# Patient Record
Sex: Female | Born: 1955
Health system: Southern US, Community
[De-identification: ages and names within clinical notes are randomized; demographics above are authoritative.]

## PROBLEM LIST (undated history)

## (undated) DIAGNOSIS — E039 Hypothyroidism, unspecified: Secondary | ICD-10-CM

## (undated) DIAGNOSIS — Z23 Encounter for immunization: Principal | ICD-10-CM

## (undated) DIAGNOSIS — E785 Hyperlipidemia, unspecified: Secondary | ICD-10-CM

## (undated) DIAGNOSIS — E079 Disorder of thyroid, unspecified: Secondary | ICD-10-CM

## (undated) DIAGNOSIS — C8190 Hodgkin lymphoma, unspecified, unspecified site: Secondary | ICD-10-CM

## (undated) HISTORY — DX: Encounter for immunization: Z23

## (undated) HISTORY — DX: Hyperlipidemia, unspecified: E78.5

## (undated) HISTORY — PX: TONSILLECTOMY: SUR1361

## (undated) HISTORY — DX: Disorder of thyroid, unspecified: E07.9

## (undated) HISTORY — DX: Hodgkin lymphoma, unspecified, unspecified site: C81.90

## (undated) HISTORY — DX: Hypothyroidism, unspecified: E03.9

## (undated) HISTORY — PX: OTHER SURGICAL HISTORY: SHX169

---

## 1998-01-19 ENCOUNTER — Other Ambulatory Visit: Admission: RE | Admit: 1998-01-19 | Discharge: 1998-01-19 | Payer: Self-pay | Admitting: Obstetrics and Gynecology

## 1999-03-06 ENCOUNTER — Other Ambulatory Visit: Admission: RE | Admit: 1999-03-06 | Discharge: 1999-03-06 | Payer: Self-pay | Admitting: Obstetrics and Gynecology

## 1999-11-14 ENCOUNTER — Encounter: Payer: Self-pay | Admitting: Obstetrics and Gynecology

## 1999-11-14 ENCOUNTER — Encounter: Admission: RE | Admit: 1999-11-14 | Discharge: 1999-11-14 | Payer: Self-pay | Admitting: Obstetrics and Gynecology

## 2000-02-21 ENCOUNTER — Encounter: Admission: RE | Admit: 2000-02-21 | Discharge: 2000-02-21 | Payer: Self-pay | Admitting: Oncology

## 2000-02-21 ENCOUNTER — Encounter (HOSPITAL_COMMUNITY): Payer: Self-pay | Admitting: Oncology

## 2000-05-19 ENCOUNTER — Other Ambulatory Visit: Admission: RE | Admit: 2000-05-19 | Discharge: 2000-05-19 | Payer: Self-pay | Admitting: Obstetrics and Gynecology

## 2000-12-25 ENCOUNTER — Encounter: Admission: RE | Admit: 2000-12-25 | Discharge: 2000-12-25 | Payer: Self-pay | Admitting: Oncology

## 2000-12-25 ENCOUNTER — Encounter (HOSPITAL_COMMUNITY): Payer: Self-pay | Admitting: Oncology

## 2001-02-19 ENCOUNTER — Encounter: Admission: RE | Admit: 2001-02-19 | Discharge: 2001-02-19 | Payer: Self-pay | Admitting: Oncology

## 2001-02-19 ENCOUNTER — Encounter (HOSPITAL_COMMUNITY): Admission: RE | Admit: 2001-02-19 | Discharge: 2001-03-10 | Payer: Self-pay | Admitting: Oncology

## 2001-02-19 ENCOUNTER — Encounter (HOSPITAL_COMMUNITY): Payer: Self-pay | Admitting: Oncology

## 2001-05-31 ENCOUNTER — Other Ambulatory Visit: Admission: RE | Admit: 2001-05-31 | Discharge: 2001-05-31 | Payer: Self-pay | Admitting: Obstetrics and Gynecology

## 2002-03-14 ENCOUNTER — Encounter: Payer: Self-pay | Admitting: Obstetrics and Gynecology

## 2002-03-14 ENCOUNTER — Encounter: Admission: RE | Admit: 2002-03-14 | Discharge: 2002-03-14 | Payer: Self-pay | Admitting: Obstetrics and Gynecology

## 2002-03-30 ENCOUNTER — Encounter: Admission: RE | Admit: 2002-03-30 | Discharge: 2002-03-30 | Payer: Self-pay | Admitting: Oncology

## 2002-03-30 ENCOUNTER — Encounter (HOSPITAL_COMMUNITY): Payer: Self-pay | Admitting: Oncology

## 2002-09-20 ENCOUNTER — Encounter: Admission: RE | Admit: 2002-09-20 | Discharge: 2002-09-20 | Payer: Self-pay | Admitting: Oncology

## 2002-09-20 ENCOUNTER — Encounter (HOSPITAL_COMMUNITY): Payer: Self-pay | Admitting: Oncology

## 2002-09-27 ENCOUNTER — Other Ambulatory Visit: Admission: RE | Admit: 2002-09-27 | Discharge: 2002-09-27 | Payer: Self-pay | Admitting: Obstetrics and Gynecology

## 2003-04-14 ENCOUNTER — Encounter: Admission: RE | Admit: 2003-04-14 | Discharge: 2003-04-14 | Payer: Self-pay | Admitting: Oncology

## 2003-04-14 ENCOUNTER — Encounter (HOSPITAL_COMMUNITY): Payer: Self-pay | Admitting: Oncology

## 2003-04-14 ENCOUNTER — Encounter (HOSPITAL_COMMUNITY): Admission: RE | Admit: 2003-04-14 | Discharge: 2003-05-11 | Payer: Self-pay | Admitting: Oncology

## 2003-04-24 ENCOUNTER — Encounter: Admission: RE | Admit: 2003-04-24 | Discharge: 2003-04-24 | Payer: Self-pay | Admitting: Obstetrics and Gynecology

## 2003-04-24 ENCOUNTER — Encounter: Payer: Self-pay | Admitting: Obstetrics and Gynecology

## 2003-10-24 ENCOUNTER — Other Ambulatory Visit: Admission: RE | Admit: 2003-10-24 | Discharge: 2003-10-24 | Payer: Self-pay | Admitting: Obstetrics and Gynecology

## 2004-04-10 ENCOUNTER — Encounter: Admission: RE | Admit: 2004-04-10 | Discharge: 2004-05-10 | Payer: Self-pay | Admitting: Oncology

## 2004-04-10 ENCOUNTER — Encounter (HOSPITAL_COMMUNITY): Admission: RE | Admit: 2004-04-10 | Discharge: 2004-05-10 | Payer: Self-pay | Admitting: Oncology

## 2004-04-10 ENCOUNTER — Encounter: Admission: RE | Admit: 2004-04-10 | Discharge: 2004-04-10 | Payer: Self-pay | Admitting: Oncology

## 2004-04-25 ENCOUNTER — Encounter: Admission: RE | Admit: 2004-04-25 | Discharge: 2004-04-25 | Payer: Self-pay | Admitting: Oncology

## 2004-04-25 ENCOUNTER — Encounter: Admission: RE | Admit: 2004-04-25 | Discharge: 2004-04-25 | Payer: Self-pay | Admitting: Obstetrics and Gynecology

## 2005-04-09 ENCOUNTER — Ambulatory Visit (HOSPITAL_COMMUNITY): Payer: Self-pay | Admitting: Oncology

## 2005-04-09 ENCOUNTER — Encounter: Admission: RE | Admit: 2005-04-09 | Discharge: 2005-05-09 | Payer: Self-pay | Admitting: Oncology

## 2005-04-09 ENCOUNTER — Encounter: Admission: RE | Admit: 2005-04-09 | Discharge: 2005-04-09 | Payer: Self-pay | Admitting: Oncology

## 2005-04-09 ENCOUNTER — Encounter (HOSPITAL_COMMUNITY): Admission: RE | Admit: 2005-04-09 | Discharge: 2005-05-09 | Payer: Self-pay | Admitting: Oncology

## 2005-05-22 ENCOUNTER — Encounter: Admission: RE | Admit: 2005-05-22 | Discharge: 2005-05-22 | Payer: Self-pay | Admitting: Obstetrics and Gynecology

## 2006-04-27 ENCOUNTER — Ambulatory Visit (HOSPITAL_COMMUNITY): Payer: Self-pay | Admitting: Oncology

## 2006-04-27 ENCOUNTER — Encounter: Admission: RE | Admit: 2006-04-27 | Discharge: 2006-05-08 | Payer: Self-pay | Admitting: Oncology

## 2006-04-27 ENCOUNTER — Encounter (HOSPITAL_COMMUNITY): Admission: RE | Admit: 2006-04-27 | Discharge: 2006-05-08 | Payer: Self-pay | Admitting: Oncology

## 2006-04-27 ENCOUNTER — Encounter: Admission: RE | Admit: 2006-04-27 | Discharge: 2006-04-27 | Payer: Self-pay | Admitting: Oncology

## 2006-06-17 ENCOUNTER — Encounter: Admission: RE | Admit: 2006-06-17 | Discharge: 2006-06-17 | Payer: Self-pay | Admitting: Obstetrics and Gynecology

## 2007-05-21 ENCOUNTER — Encounter: Admission: RE | Admit: 2007-05-21 | Discharge: 2007-05-21 | Payer: Self-pay | Admitting: Oncology

## 2007-05-21 ENCOUNTER — Encounter (HOSPITAL_COMMUNITY): Admission: RE | Admit: 2007-05-21 | Discharge: 2007-06-20 | Payer: Self-pay | Admitting: Oncology

## 2007-05-21 ENCOUNTER — Ambulatory Visit (HOSPITAL_COMMUNITY): Payer: Self-pay | Admitting: Oncology

## 2007-06-30 ENCOUNTER — Encounter: Admission: RE | Admit: 2007-06-30 | Discharge: 2007-06-30 | Payer: Self-pay | Admitting: Obstetrics and Gynecology

## 2008-05-26 ENCOUNTER — Encounter: Admission: RE | Admit: 2008-05-26 | Discharge: 2008-05-26 | Payer: Self-pay | Admitting: Oncology

## 2008-05-26 ENCOUNTER — Encounter (HOSPITAL_COMMUNITY): Admission: RE | Admit: 2008-05-26 | Discharge: 2008-06-25 | Payer: Self-pay | Admitting: Oncology

## 2008-05-26 ENCOUNTER — Ambulatory Visit (HOSPITAL_COMMUNITY): Payer: Self-pay | Admitting: Oncology

## 2008-06-30 ENCOUNTER — Encounter: Admission: RE | Admit: 2008-06-30 | Discharge: 2008-06-30 | Payer: Self-pay | Admitting: Oncology

## 2008-07-02 ENCOUNTER — Encounter: Admission: RE | Admit: 2008-07-02 | Discharge: 2008-07-02 | Payer: Self-pay | Admitting: Oncology

## 2009-05-25 ENCOUNTER — Encounter (HOSPITAL_COMMUNITY): Admission: RE | Admit: 2009-05-25 | Discharge: 2009-06-24 | Payer: Self-pay | Admitting: Oncology

## 2009-05-25 ENCOUNTER — Ambulatory Visit (HOSPITAL_COMMUNITY): Payer: Self-pay | Admitting: Oncology

## 2009-07-16 ENCOUNTER — Encounter: Admission: RE | Admit: 2009-07-16 | Discharge: 2009-07-16 | Payer: Self-pay | Admitting: Obstetrics and Gynecology

## 2009-08-24 ENCOUNTER — Encounter (HOSPITAL_COMMUNITY): Admission: RE | Admit: 2009-08-24 | Discharge: 2009-09-23 | Payer: Self-pay | Admitting: Oncology

## 2009-08-24 ENCOUNTER — Ambulatory Visit (HOSPITAL_COMMUNITY): Payer: Self-pay | Admitting: Oncology

## 2010-06-17 ENCOUNTER — Encounter (HOSPITAL_COMMUNITY)
Admission: RE | Admit: 2010-06-17 | Discharge: 2010-07-17 | Payer: Self-pay | Source: Home / Self Care | Admitting: Oncology

## 2010-06-17 ENCOUNTER — Encounter: Admission: RE | Admit: 2010-06-17 | Discharge: 2010-06-17 | Payer: Self-pay | Admitting: Oncology

## 2010-06-17 ENCOUNTER — Ambulatory Visit (HOSPITAL_COMMUNITY): Payer: Self-pay | Admitting: Oncology

## 2010-08-28 ENCOUNTER — Encounter
Admission: RE | Admit: 2010-08-28 | Discharge: 2010-08-28 | Payer: Self-pay | Source: Home / Self Care | Attending: Obstetrics and Gynecology | Admitting: Obstetrics and Gynecology

## 2010-09-16 ENCOUNTER — Other Ambulatory Visit (HOSPITAL_COMMUNITY): Payer: Self-pay | Admitting: Oncology

## 2010-09-16 DIAGNOSIS — Z8571 Personal history of Hodgkin lymphoma: Secondary | ICD-10-CM

## 2010-10-01 ENCOUNTER — Ambulatory Visit
Admission: RE | Admit: 2010-10-01 | Discharge: 2010-10-01 | Disposition: A | Discharge status: Home / Self Care | Payer: 59 | Source: Ambulatory Visit | Attending: Oncology | Admitting: Oncology

## 2010-10-01 DIAGNOSIS — Z8571 Personal history of Hodgkin lymphoma: Secondary | ICD-10-CM

## 2010-10-01 MED ORDER — GADOBENATE DIMEGLUMINE 529 MG/ML IV SOLN
16.0000 mL | Freq: Once | INTRAVENOUS | Status: AC | PRN
  Administered 2010-10-01: 16 mL via INTRAVENOUS

## 2010-10-22 LAB — DIFFERENTIAL
Basophils Absolute: 0 10*3/uL (ref 0.0–0.1)
Lymphocytes Relative: 19 % (ref 12–46)
Lymphs Abs: 1.3 10*3/uL (ref 0.7–4.0)
Monocytes Absolute: 0.7 10*3/uL (ref 0.1–1.0)
Monocytes Relative: 10 % (ref 3–12)
Neutrophils Relative %: 71 % (ref 43–77)

## 2010-10-22 LAB — COMPREHENSIVE METABOLIC PANEL
Albumin: 4 g/dL (ref 3.5–5.2)
CO2: 26 mEq/L (ref 19–32)
Calcium: 9.4 mg/dL (ref 8.4–10.5)
Chloride: 106 mEq/L (ref 96–112)
Creatinine, Ser: 0.97 mg/dL (ref 0.4–1.2)
GFR calc Af Amer: >60 mL/min (ref >60)
GFR calc non Af Amer: 60 mL/min — ABNORMAL LOW (ref >60)
Glucose, Bld: 104 mg/dL — ABNORMAL HIGH (ref 70–99)
Potassium: 4.3 mEq/L (ref 3.5–5.1)
Sodium: 140 mEq/L (ref 135–145)
Total Protein: 7 g/dL (ref 6.0–8.3)

## 2010-10-22 LAB — LIPID PANEL
HDL: 61 mg/dL (ref >39)
Total CHOL/HDL Ratio: 3.4 RATIO
Triglycerides: 47 mg/dL (ref <150)
VLDL: 9 mg/dL (ref 0–40)

## 2010-10-22 LAB — SEDIMENTATION RATE: Sed Rate: 5 mm/hr (ref 0–22)

## 2010-10-22 LAB — CBC
RDW: 14.2 % (ref 11.5–15.5)
WBC: 7.1 10*3/uL (ref 4.0–10.5)

## 2010-10-22 LAB — LACTATE DEHYDROGENASE: LDH: 142 U/L (ref 94–250)

## 2010-10-22 LAB — FOLATE: Folate: >20 ng/mL

## 2010-10-26 LAB — TSH: TSH: 0.912 u[IU]/mL (ref 0.350–4.500)

## 2010-11-14 LAB — DIFFERENTIAL
Basophils Absolute: 0 10*3/uL (ref 0.0–0.1)
Basophils Relative: 0 % (ref 0–1)
Eosinophils Absolute: 0.1 10*3/uL (ref 0.0–0.7)
Eosinophils Relative: 2 % (ref 0–5)
Lymphocytes Relative: 18 % (ref 12–46)

## 2010-11-14 LAB — COMPREHENSIVE METABOLIC PANEL
ALT: 20 U/L (ref 0–35)
AST: 21 U/L (ref 0–37)
Alkaline Phosphatase: 67 U/L (ref 39–117)
CO2: 27 mEq/L (ref 19–32)
Chloride: 103 mEq/L (ref 96–112)
Creatinine, Ser: 0.9 mg/dL (ref 0.4–1.2)
GFR calc Af Amer: >60 mL/min (ref >60)
GFR calc non Af Amer: >60 mL/min (ref >60)
Potassium: 4.4 mEq/L (ref 3.5–5.1)
Total Bilirubin: 1.1 mg/dL (ref 0.3–1.2)

## 2010-11-14 LAB — CBC
MCV: 90.7 fL (ref 78.0–100.0)
RBC: 4.94 MIL/uL (ref 3.87–5.11)
WBC: 7.7 10*3/uL (ref 4.0–10.5)

## 2010-11-14 LAB — LIPID PANEL
HDL: 55 mg/dL (ref >39)
Total CHOL/HDL Ratio: 4.1 RATIO
Triglycerides: 74 mg/dL (ref <150)

## 2010-11-14 LAB — SEDIMENTATION RATE: Sed Rate: 20 mm/hr (ref 0–22)

## 2010-12-16 ENCOUNTER — Other Ambulatory Visit (HOSPITAL_COMMUNITY): Payer: Self-pay | Admitting: Oncology

## 2010-12-16 ENCOUNTER — Encounter (HOSPITAL_COMMUNITY): Payer: 59 | Attending: Oncology

## 2010-12-16 DIAGNOSIS — E039 Hypothyroidism, unspecified: Secondary | ICD-10-CM | POA: Insufficient documentation

## 2010-12-16 DIAGNOSIS — C8119 Nodular sclerosis classical Hodgkin lymphoma, extranodal and solid organ sites: Secondary | ICD-10-CM | POA: Insufficient documentation

## 2010-12-16 DIAGNOSIS — C8190 Hodgkin lymphoma, unspecified, unspecified site: Secondary | ICD-10-CM

## 2010-12-16 DIAGNOSIS — Z9221 Personal history of antineoplastic chemotherapy: Secondary | ICD-10-CM | POA: Insufficient documentation

## 2010-12-16 DIAGNOSIS — Z79899 Other long term (current) drug therapy: Secondary | ICD-10-CM | POA: Insufficient documentation

## 2010-12-16 LAB — TSH: TSH: 1.414 u[IU]/mL (ref 0.350–4.500)

## 2011-05-12 LAB — DIFFERENTIAL
Basophils Absolute: 0
Eosinophils Relative: 1
Lymphocytes Relative: 15
Monocytes Absolute: 0.7
Monocytes Relative: 9
Neutro Abs: 6.3

## 2011-05-12 LAB — CBC
Platelets: 217
RDW: 14.7
WBC: 8.4

## 2011-05-12 LAB — COMPREHENSIVE METABOLIC PANEL
AST: 20
Albumin: 4.1
Alkaline Phosphatase: 61
Chloride: 107
Creatinine, Ser: 0.9
GFR calc Af Amer: >60
Potassium: 4.6
Total Bilirubin: 1
Total Protein: 6.9

## 2011-05-12 LAB — LIPID PANEL
Cholesterol: 209 — ABNORMAL HIGH
HDL: 46
LDL Cholesterol: 152 — ABNORMAL HIGH
Total CHOL/HDL Ratio: 4.5
VLDL: 11

## 2011-05-22 LAB — CBC
HCT: 43.1
MCHC: 33.7
MCV: 88.7
Platelets: 257
RDW: 14.6 — ABNORMAL HIGH
WBC: 7.9

## 2011-05-22 LAB — LIPID PANEL
Cholesterol: 213 — ABNORMAL HIGH
Triglycerides: 83

## 2011-05-22 LAB — DIFFERENTIAL
Basophils Absolute: 0
Eosinophils Relative: 3
Lymphocytes Relative: 25
Lymphs Abs: 2
Monocytes Absolute: 0.8 — ABNORMAL HIGH
Neutro Abs: 4.8

## 2011-05-22 LAB — LACTATE DEHYDROGENASE: LDH: 138

## 2011-05-22 LAB — COMPREHENSIVE METABOLIC PANEL
AST: 16
Albumin: 4
BUN: 19
Calcium: 9.7
Creatinine, Ser: 0.96
GFR calc Af Amer: >60
Total Bilirubin: 0.9
Total Protein: 6.9

## 2011-05-22 LAB — TSH: TSH: 4.155

## 2011-06-16 ENCOUNTER — Encounter (HOSPITAL_COMMUNITY): Payer: Self-pay | Admitting: Oncology

## 2011-06-16 ENCOUNTER — Ambulatory Visit
Admission: RE | Admit: 2011-06-16 | Discharge: 2011-06-16 | Disposition: A | Discharge status: Home / Self Care | Payer: 59 | Source: Ambulatory Visit | Attending: Oncology | Admitting: Oncology

## 2011-06-16 ENCOUNTER — Other Ambulatory Visit (HOSPITAL_COMMUNITY): Payer: Self-pay | Admitting: Oncology

## 2011-06-16 ENCOUNTER — Encounter (HOSPITAL_COMMUNITY): Payer: 59 | Attending: Oncology | Admitting: Oncology

## 2011-06-16 DIAGNOSIS — E7800 Pure hypercholesterolemia, unspecified: Secondary | ICD-10-CM | POA: Insufficient documentation

## 2011-06-16 DIAGNOSIS — C8190 Hodgkin lymphoma, unspecified, unspecified site: Secondary | ICD-10-CM | POA: Insufficient documentation

## 2011-06-16 DIAGNOSIS — E079 Disorder of thyroid, unspecified: Secondary | ICD-10-CM | POA: Insufficient documentation

## 2011-06-16 DIAGNOSIS — C8119 Nodular sclerosis classical Hodgkin lymphoma, extranodal and solid organ sites: Secondary | ICD-10-CM

## 2011-06-16 DIAGNOSIS — E039 Hypothyroidism, unspecified: Secondary | ICD-10-CM

## 2011-06-16 LAB — COMPREHENSIVE METABOLIC PANEL
ALT: 17 U/L (ref 0–35)
AST: 17 U/L (ref 0–37)
Alkaline Phosphatase: 85 U/L (ref 39–117)
CO2: 30 mEq/L (ref 19–32)
Chloride: 103 mEq/L (ref 96–112)
GFR calc Af Amer: 78 mL/min — ABNORMAL LOW (ref >90)
GFR calc non Af Amer: 67 mL/min — ABNORMAL LOW (ref >90)
Glucose, Bld: 109 mg/dL — ABNORMAL HIGH (ref 70–99)
Potassium: 4.2 mEq/L (ref 3.5–5.1)
Sodium: 140 mEq/L (ref 135–145)
Total Bilirubin: 0.5 mg/dL (ref 0.3–1.2)

## 2011-06-16 LAB — DIFFERENTIAL
Basophils Absolute: 0 10*3/uL (ref 0.0–0.1)
Basophils Relative: 0 % (ref 0–1)
Eosinophils Absolute: 0.1 10*3/uL (ref 0.0–0.7)
Eosinophils Relative: 1 % (ref 0–5)
Lymphocytes Relative: 15 % (ref 12–46)
Monocytes Absolute: 0.7 10*3/uL (ref 0.1–1.0)

## 2011-06-16 LAB — TSH: TSH: 2.739 u[IU]/mL (ref 0.350–4.500)

## 2011-06-16 LAB — CBC
Hemoglobin: 13.9 g/dL (ref 12.0–15.0)
MCH: 29.2 pg (ref 26.0–34.0)
Platelets: 258 10*3/uL (ref 150–400)
RBC: 4.76 MIL/uL (ref 3.87–5.11)
WBC: 9.5 10*3/uL (ref 4.0–10.5)

## 2011-06-16 LAB — LIPID PANEL
HDL: 61 mg/dL (ref >39)
LDL Cholesterol: 125 mg/dL — ABNORMAL HIGH (ref 0–99)

## 2011-06-16 LAB — LACTATE DEHYDROGENASE: LDH: 174 U/L (ref 94–250)

## 2011-06-16 MED ORDER — INFLUENZA VIRUS VACC SPLIT PF IM SUSP
INTRAMUSCULAR | Status: AC
  Administered 2011-06-16: 0.5 mL via INTRAMUSCULAR
  Filled 2011-06-16: qty 0.5

## 2011-06-16 NOTE — Patient Instructions (Signed)
[facility]  Discharge Instructions  RECOMMENDATIONS MADE BY THE CONSULTANT AND ANY TEST RESULTS WILL BE SENT TO YOUR REFERRING DOCTOR.   EXAM FINDINGS BY MD TODAY AND SIGNS AND SYMPTOMS TO REPORT TO CLINIC OR PRIMARY MD: You are doing well, report any new lumps, recurrent infections, night sweats, etc.  MEDICATIONS PRESCRIBED: none      SPECIAL INSTRUCTIONS/FOLLOW-UP: Lab work Needed :  TSH in 6 months, Xray Studies Needed Chest Xray in 1 year and Return to Clinic on : 1 year   I acknowledge that I have been informed and understand all the instructions given to me and received a copy. I do not have any more questions at this time, but understand that I may call the Specialty Clinic at [facility] at [phone] during business hours should I have any further questions or need assistance in obtaining follow-up care.    __________________________________________  _____________  __________ Signature of Patient or Authorized Representative            Date                   Time    __________________________________________ Nurse's Signature

## 2011-06-16 NOTE — Progress Notes (Signed)
This office note has been dictated.

## 2011-06-17 NOTE — Progress Notes (Signed)
CC:   Shannon Kim. Shannon Kim, M.D.  DIAGNOSES: 1. Nodular sclerosing Hodgkin's disease presenting in 1987, treated at     that time with an ABVD regimen plus radiation therapy in a mantle     field.  She then relapsed in April 1990, and I treated her with     cisplatin, VP-16 and CCNU alternated with an ABVD hybrid regimen     and achieved complete remission and has not recurred since. 2. Hypothyroidism on therapy. 3. Estrogen deficiency on replacement therapy,in low doses. 4. Colonoscopy in 2006 by Dr. Ritta Slot and she should have followup     with him in the very near future.  Shannon Kim is doing well.  She is now 55 years old.  Weight is stable.  It is a little bit high for her height, but she has a very good BMI of only 25.7.  Other vital signs are fine.  REVIEW OF SYSTEMS:  Oncologically is negative.  She had a chest x-ray today which is also negative.  Her lab work is excellent.  She has a normal CBC.  She has a normal total protein.  Her glucose was barely elevated at 109 and cholesterol level is 202, but with a HDL level of 61 and a cholesterol HDL ratio of only 3.3.  Her LDL was minimally elevated at 125.  Her TSH is pending from today, so I will call her with results once we get those results.  PHYSICAL EXAMINATION:  No adenopathy.  Clear lung fields.  Heart: Regular rhythm and rate without murmur, rub or gallop.  Her gynecologist does her breast exams, and that was deferred today.  Abdomen:  Soft, nontender, without organomegaly or masses.  Bowel sounds are normal. She has no peripheral edema.  She is alert and oriented.  She is doing great.  We will see her in a year, but she will come in 6 months for repeat TSH level.    ______________________________ Shannon Kim. Shannon Sleet, MD ESN/MEDQ  D:  06/16/2011  T:  06/17/2011  Job:  [id number]

## 2011-06-18 ENCOUNTER — Other Ambulatory Visit (HOSPITAL_COMMUNITY): Payer: Self-pay | Admitting: Oncology

## 2011-06-18 DIAGNOSIS — E7800 Pure hypercholesterolemia, unspecified: Secondary | ICD-10-CM

## 2011-09-11 ENCOUNTER — Other Ambulatory Visit (HOSPITAL_COMMUNITY): Payer: Self-pay | Admitting: Oncology

## 2011-10-10 ENCOUNTER — Other Ambulatory Visit: Payer: Self-pay | Admitting: Obstetrics and Gynecology

## 2011-10-10 DIAGNOSIS — Z1231 Encounter for screening mammogram for malignant neoplasm of breast: Secondary | ICD-10-CM

## 2011-10-20 ENCOUNTER — Ambulatory Visit
Admission: RE | Admit: 2011-10-20 | Discharge: 2011-10-20 | Disposition: A | Discharge status: Home / Self Care | Payer: 59 | Source: Ambulatory Visit | Attending: Obstetrics and Gynecology | Admitting: Obstetrics and Gynecology

## 2011-10-20 DIAGNOSIS — Z1231 Encounter for screening mammogram for malignant neoplasm of breast: Secondary | ICD-10-CM

## 2011-12-12 ENCOUNTER — Other Ambulatory Visit (HOSPITAL_COMMUNITY): Payer: 59

## 2012-01-02 ENCOUNTER — Other Ambulatory Visit (HOSPITAL_COMMUNITY): Payer: 59

## 2012-01-16 ENCOUNTER — Encounter (HOSPITAL_COMMUNITY): Payer: 59 | Attending: Oncology

## 2012-01-16 DIAGNOSIS — E7800 Pure hypercholesterolemia, unspecified: Secondary | ICD-10-CM | POA: Insufficient documentation

## 2012-01-16 DIAGNOSIS — C8190 Hodgkin lymphoma, unspecified, unspecified site: Secondary | ICD-10-CM | POA: Insufficient documentation

## 2012-01-16 DIAGNOSIS — E039 Hypothyroidism, unspecified: Secondary | ICD-10-CM | POA: Insufficient documentation

## 2012-01-16 LAB — BASIC METABOLIC PANEL
CO2: 26 mEq/L (ref 19–32)
Chloride: 101 mEq/L (ref 96–112)
Creatinine, Ser: 1 mg/dL (ref 0.50–1.10)
Glucose, Bld: 75 mg/dL (ref 70–99)

## 2012-01-16 LAB — LIPID PANEL
LDL Cholesterol: 128 mg/dL — ABNORMAL HIGH (ref 0–99)
Triglycerides: 81 mg/dL (ref <150)

## 2012-01-16 LAB — TSH: TSH: 3.018 u[IU]/mL (ref 0.350–4.500)

## 2012-01-16 NOTE — Progress Notes (Signed)
Shannon Kim presented for [employer]. Labs per MD order drawn via Peripheral Line 23 gauge needle inserted in right antecubital.  Good blood return present. Procedure without incident.  Needle removed intact. Patient tolerated procedure well. Patient reports she was not informed that she needed to be fasting for lipid panel. Reports she ate breakfast at 7am today and it consisted of yogurt, blueberries, granola, coffee with sugar and cream.

## 2012-03-13 ENCOUNTER — Other Ambulatory Visit (HOSPITAL_COMMUNITY): Payer: Self-pay | Admitting: Oncology

## 2012-06-15 ENCOUNTER — Encounter (HOSPITAL_COMMUNITY): Payer: 59 | Attending: Oncology | Admitting: Oncology

## 2012-06-15 ENCOUNTER — Encounter (HOSPITAL_COMMUNITY): Payer: Self-pay | Admitting: Oncology

## 2012-06-15 ENCOUNTER — Ambulatory Visit
Admission: RE | Admit: 2012-06-15 | Discharge: 2012-06-15 | Disposition: A | Discharge status: Home / Self Care | Payer: 59 | Source: Ambulatory Visit | Attending: Oncology | Admitting: Oncology

## 2012-06-15 VITALS — BP 128/86 | HR 99 | Temp 98.3°F | Resp 18 | Ht 71.0 in | Wt 176.8 lb

## 2012-06-15 DIAGNOSIS — Z23 Encounter for immunization: Secondary | ICD-10-CM

## 2012-06-15 DIAGNOSIS — Z09 Encounter for follow-up examination after completed treatment for conditions other than malignant neoplasm: Secondary | ICD-10-CM | POA: Insufficient documentation

## 2012-06-15 DIAGNOSIS — E079 Disorder of thyroid, unspecified: Secondary | ICD-10-CM

## 2012-06-15 DIAGNOSIS — E039 Hypothyroidism, unspecified: Secondary | ICD-10-CM | POA: Insufficient documentation

## 2012-06-15 DIAGNOSIS — Z8571 Personal history of Hodgkin lymphoma: Secondary | ICD-10-CM

## 2012-06-15 DIAGNOSIS — C8119 Nodular sclerosis classical Hodgkin lymphoma, extranodal and solid organ sites: Secondary | ICD-10-CM

## 2012-06-15 LAB — CBC WITH DIFFERENTIAL/PLATELET
Eosinophils Relative: 2 % (ref 0–5)
HCT: 44.1 % (ref 36.0–46.0)
Lymphocytes Relative: 18 % (ref 12–46)
Lymphs Abs: 1.4 10*3/uL (ref 0.7–4.0)
MCH: 30.2 pg (ref 26.0–34.0)
MCV: 90 fL (ref 78.0–100.0)
Monocytes Absolute: 0.7 10*3/uL (ref 0.1–1.0)
RBC: 4.9 MIL/uL (ref 3.87–5.11)
RDW: 14.3 % (ref 11.5–15.5)
WBC: 8 10*3/uL (ref 4.0–10.5)

## 2012-06-15 LAB — LIPID PANEL
Cholesterol: 193 mg/dL (ref 0–200)
HDL: 54 mg/dL (ref >39)
Triglycerides: 80 mg/dL (ref <150)
VLDL: 16 mg/dL (ref 0–40)

## 2012-06-15 LAB — COMPREHENSIVE METABOLIC PANEL
BUN: 17 mg/dL (ref 6–23)
CO2: 27 mEq/L (ref 19–32)
Calcium: 9.8 mg/dL (ref 8.4–10.5)
Creatinine, Ser: 0.96 mg/dL (ref 0.50–1.10)
GFR calc Af Amer: 75 mL/min — ABNORMAL LOW (ref >90)
GFR calc non Af Amer: 64 mL/min — ABNORMAL LOW (ref >90)
Glucose, Bld: 108 mg/dL — ABNORMAL HIGH (ref 70–99)

## 2012-06-15 MED ORDER — INFLUENZA VIRUS VACC SPLIT PF IM SUSP
INTRAMUSCULAR | Status: AC
  Filled 2012-06-15: qty 0.5

## 2012-06-15 MED ORDER — INFLUENZA VIRUS VACC SPLIT PF IM SUSP
0.5000 mL | Freq: Once | INTRAMUSCULAR | Status: AC
  Administered 2012-06-15: 0.5 mL via INTRAMUSCULAR

## 2012-06-15 NOTE — Progress Notes (Signed)
Shannon Kim presents today for injection per MD orders. Flu Vaccine administered IM in left Upper Arm. Administration without incident. Patient tolerated well.

## 2012-06-15 NOTE — Patient Instructions (Addendum)
[facility]  Discharge Instructions  RECOMMENDATIONS MADE BY THE CONSULTANT AND ANY TEST RESULTS WILL BE SENT TO YOUR REFERRING DOCTOR.   EXAM FINDINGS BY MD TODAY AND SIGNS AND SYMPTOMS TO REPORT TO CLINIC OR PRIMARY MD: exam and discussion by MD.  Bonita Quin are doing well.  We will give you a flu vaccine today and check some bloodwork.  MEDICATIONS PRESCRIBED: none   INSTRUCTIONS GIVEN AND DISCUSSED: Other :  Report night sweats, any new lumps, shortness of breath, recurring infections, etc.  SPECIAL INSTRUCTIONS/FOLLOW-UP: Lab work Needed today and in 6 months, Xray Studies Needed today and Return to Clinic in 12 months to see MD.   I acknowledge that I have been informed and understand all the instructions given to me and received a copy. I do not have any more questions at this time, but understand that I may call the Specialty Clinic at [facility] at [phone] during business hours should I have any further questions or need assistance in obtaining follow-up care.    __________________________________________  _____________  __________ Signature of Patient or Authorized Representative            Date                   Time    __________________________________________ Nurse's Signature

## 2012-06-15 NOTE — Progress Notes (Signed)
Problem #1 nodular sclerosing Hodgkin's disease presenting in 1987, treated at that time with the ABVD regimen plus radiation therapy in a mantle field. She then relapsed in April 1990 and was treated with cis-platinum, VP-16, and CCNU alternating with ABVD hybrid regimen. She has been disease-free since Problem #2 hypothyroidism Problem #3 estrogen deficiency on replacement doses Problem #4 colonoscopy in 2006 by Dr. Kinnie Scales and she should be due for followup in the near future She is doing well except for anxiety over her brother who has recurrent mantle cell lymphoma. He is status post allogeneic transplant several months ago and is now having graft versus host disease issues.  She is exercising more watching her diet better and has lost 11 pounds. I have encouraged her to keep going with this regimen. She is working full-time. Review of systems is negative oncologically. Vital signs otherwise are stable. Lymph nodes remain negative throughout. Her lungs are clear to auscultation and percussion. Heart shows a regular rhythm and rate without murmur rub or gallop. Her gynecologist does her GYN. and breast exams. Abdomen remains soft, nontender, without hepatosplenomegaly, without ascites. She has no leg edema no arm edema. She looks symmetrical in the head and neck area as well. There is no thyromegaly at this time of course. She is due for chest x-ray today and some blood work now as well as in 6 months. We will check her TSH in 6 months along with her liver enzymes and lipids. I will see her in one year

## 2012-06-16 LAB — TSH: TSH: 2.034 u[IU]/mL (ref 0.350–4.500)

## 2012-07-31 ENCOUNTER — Other Ambulatory Visit (HOSPITAL_COMMUNITY): Payer: Self-pay | Admitting: Oncology

## 2012-09-13 ENCOUNTER — Other Ambulatory Visit (HOSPITAL_COMMUNITY): Payer: Self-pay | Admitting: Oncology

## 2012-11-23 ENCOUNTER — Other Ambulatory Visit: Payer: Self-pay

## 2012-11-23 DIAGNOSIS — Z1231 Encounter for screening mammogram for malignant neoplasm of breast: Secondary | ICD-10-CM

## 2012-12-03 ENCOUNTER — Other Ambulatory Visit: Payer: Self-pay | Admitting: Obstetrics and Gynecology

## 2012-12-03 DIAGNOSIS — Z780 Asymptomatic menopausal state: Secondary | ICD-10-CM

## 2012-12-13 ENCOUNTER — Ambulatory Visit: Payer: 59

## 2012-12-13 ENCOUNTER — Other Ambulatory Visit (HOSPITAL_COMMUNITY): Payer: 59

## 2012-12-13 ENCOUNTER — Other Ambulatory Visit (HOSPITAL_COMMUNITY): Payer: Self-pay | Admitting: Oncology

## 2012-12-13 DIAGNOSIS — E079 Disorder of thyroid, unspecified: Secondary | ICD-10-CM

## 2012-12-13 MED ORDER — SYNTHROID 125 MCG PO TABS: 125.0000 ug | ORAL_TABLET | Freq: Every day | ORAL | Status: DC

## 2012-12-14 ENCOUNTER — Ambulatory Visit: Payer: 59

## 2012-12-14 ENCOUNTER — Other Ambulatory Visit: Payer: 59

## 2012-12-29 ENCOUNTER — Ambulatory Visit
Admission: RE | Admit: 2012-12-29 | Discharge: 2012-12-29 | Disposition: A | Discharge status: Home / Self Care | Payer: 59 | Source: Ambulatory Visit

## 2012-12-29 ENCOUNTER — Ambulatory Visit
Admission: RE | Admit: 2012-12-29 | Discharge: 2012-12-29 | Disposition: A | Discharge status: Home / Self Care | Payer: 59 | Source: Ambulatory Visit | Attending: Obstetrics and Gynecology | Admitting: Obstetrics and Gynecology

## 2012-12-29 ENCOUNTER — Encounter (HOSPITAL_COMMUNITY): Payer: 59 | Attending: Hematology and Oncology

## 2012-12-29 DIAGNOSIS — C8119 Nodular sclerosis classical Hodgkin lymphoma, extranodal and solid organ sites: Secondary | ICD-10-CM | POA: Insufficient documentation

## 2012-12-29 DIAGNOSIS — Z780 Asymptomatic menopausal state: Secondary | ICD-10-CM

## 2012-12-29 DIAGNOSIS — E079 Disorder of thyroid, unspecified: Secondary | ICD-10-CM

## 2012-12-29 DIAGNOSIS — Z1231 Encounter for screening mammogram for malignant neoplasm of breast: Secondary | ICD-10-CM

## 2012-12-29 DIAGNOSIS — Z8571 Personal history of Hodgkin lymphoma: Secondary | ICD-10-CM

## 2012-12-29 LAB — COMPREHENSIVE METABOLIC PANEL
BUN: 18 mg/dL (ref 6–23)
CO2: 28 mEq/L (ref 19–32)
Chloride: 103 mEq/L (ref 96–112)
Creatinine, Ser: 0.96 mg/dL (ref 0.50–1.10)
GFR calc non Af Amer: 64 mL/min — ABNORMAL LOW (ref >90)
Glucose, Bld: 107 mg/dL — ABNORMAL HIGH (ref 70–99)
Total Bilirubin: 0.6 mg/dL (ref 0.3–1.2)

## 2012-12-29 LAB — LIPID PANEL
Cholesterol: 207 mg/dL — ABNORMAL HIGH (ref 0–200)
Triglycerides: 64 mg/dL (ref <150)

## 2012-12-29 NOTE — Progress Notes (Signed)
Labs drawn today for cmp,lipid,tsh

## 2013-01-31 ENCOUNTER — Other Ambulatory Visit: Payer: Self-pay | Admitting: Obstetrics and Gynecology

## 2013-01-31 DIAGNOSIS — Z8571 Personal history of Hodgkin lymphoma: Secondary | ICD-10-CM

## 2013-02-12 ENCOUNTER — Other Ambulatory Visit: Payer: 59

## 2013-02-14 ENCOUNTER — Ambulatory Visit
Admission: RE | Admit: 2013-02-14 | Discharge: 2013-02-14 | Disposition: A | Discharge status: Home / Self Care | Payer: 59 | Source: Ambulatory Visit | Attending: Obstetrics and Gynecology | Admitting: Obstetrics and Gynecology

## 2013-02-14 DIAGNOSIS — Z8571 Personal history of Hodgkin lymphoma: Secondary | ICD-10-CM

## 2013-02-14 MED ORDER — GADOBENATE DIMEGLUMINE 529 MG/ML IV SOLN
17.0000 mL | Freq: Once | INTRAVENOUS | Status: AC | PRN
  Administered 2013-02-14: 17 mL via INTRAVENOUS

## 2013-04-25 ENCOUNTER — Other Ambulatory Visit (HOSPITAL_COMMUNITY): Payer: Self-pay | Admitting: Oncology

## 2013-04-25 DIAGNOSIS — E079 Disorder of thyroid, unspecified: Secondary | ICD-10-CM

## 2013-04-25 MED ORDER — SYNTHROID 125 MCG PO TABS: 125.0000 ug | ORAL_TABLET | Freq: Every day | ORAL | Status: DC

## 2013-04-25 MED ORDER — SYNTHROID 137 MCG PO TABS: ORAL_TABLET | ORAL | Status: DC

## 2013-04-26 ENCOUNTER — Other Ambulatory Visit (HOSPITAL_COMMUNITY): Payer: Self-pay | Admitting: Oncology

## 2013-06-15 ENCOUNTER — Ambulatory Visit (HOSPITAL_COMMUNITY): Payer: 59

## 2013-06-17 NOTE — Progress Notes (Signed)
This encounter was created in error - please disregard.

## 2013-09-05 ENCOUNTER — Telehealth: Payer: Self-pay | Admitting: Hematology and Oncology

## 2013-09-05 NOTE — Telephone Encounter (Signed)
per Meade Maw pt former [facility] pt requesting transfor to [place]. per Jenny Reichmann request pt schduled w/NG and Cindy given appt for 2/4 @ 12pm. per Jenny Reichmann she will communicate appt to pt

## 2013-09-14 ENCOUNTER — Ambulatory Visit (HOSPITAL_BASED_OUTPATIENT_CLINIC_OR_DEPARTMENT_OTHER): Payer: 59

## 2013-09-14 ENCOUNTER — Ambulatory Visit (HOSPITAL_BASED_OUTPATIENT_CLINIC_OR_DEPARTMENT_OTHER): Payer: 59 | Admitting: Hematology and Oncology

## 2013-09-14 ENCOUNTER — Telehealth: Payer: Self-pay | Admitting: Hematology and Oncology

## 2013-09-14 ENCOUNTER — Encounter: Payer: Self-pay | Admitting: Hematology and Oncology

## 2013-09-14 VITALS — BP 171/101 | HR 102 | Temp 97.7°F | Resp 20 | Ht 71.0 in | Wt 185.6 lb

## 2013-09-14 DIAGNOSIS — Z87898 Personal history of other specified conditions: Secondary | ICD-10-CM

## 2013-09-14 DIAGNOSIS — E785 Hyperlipidemia, unspecified: Secondary | ICD-10-CM

## 2013-09-14 DIAGNOSIS — Z23 Encounter for immunization: Secondary | ICD-10-CM

## 2013-09-14 DIAGNOSIS — E039 Hypothyroidism, unspecified: Secondary | ICD-10-CM

## 2013-09-14 DIAGNOSIS — C8190 Hodgkin lymphoma, unspecified, unspecified site: Secondary | ICD-10-CM

## 2013-09-14 HISTORY — DX: Encounter for immunization: Z23

## 2013-09-14 HISTORY — DX: Hyperlipidemia, unspecified: E78.5

## 2013-09-14 HISTORY — DX: Hodgkin lymphoma, unspecified, unspecified site: C81.90

## 2013-09-14 HISTORY — DX: Hypothyroidism, unspecified: E03.9

## 2013-09-14 LAB — CBC WITH DIFFERENTIAL/PLATELET
BASO%: 0.2 % (ref 0.0–2.0)
BASOS ABS: 0 10*3/uL (ref 0.0–0.1)
EOS%: 1.1 % (ref 0.0–7.0)
Eosinophils Absolute: 0.1 10*3/uL (ref 0.0–0.5)
HEMATOCRIT: 43.9 % (ref 34.8–46.6)
HEMOGLOBIN: 14.5 g/dL (ref 11.6–15.9)
LYMPH#: 1.3 10*3/uL (ref 0.9–3.3)
LYMPH%: 11.9 % — ABNORMAL LOW (ref 14.0–49.7)
MCH: 29.2 pg (ref 25.1–34.0)
MCHC: 33 g/dL (ref 31.5–36.0)
MCV: 88.5 fL (ref 79.5–101.0)
MONO#: 0.7 10*3/uL (ref 0.1–0.9)
MONO%: 6.6 % (ref 0.0–14.0)
NEUT#: 8.5 10*3/uL — ABNORMAL HIGH (ref 1.5–6.5)
NEUT%: 80.2 % — ABNORMAL HIGH (ref 38.4–76.8)
Platelets: 301 10*3/uL (ref 145–400)
RBC: 4.96 10*6/uL (ref 3.70–5.45)
RDW: 14.5 % (ref 11.2–14.5)
WBC: 10.6 10*3/uL — AB (ref 3.9–10.3)
nRBC: 0 % (ref 0–0)

## 2013-09-14 LAB — COMPREHENSIVE METABOLIC PANEL (CC13)
ALK PHOS: 75 U/L (ref 40–150)
ALT: 18 U/L (ref 0–55)
AST: 17 U/L (ref 5–34)
Albumin: 4.1 g/dL (ref 3.5–5.0)
Anion Gap: 9 mEq/L (ref 3–11)
BUN: 21.5 mg/dL (ref 7.0–26.0)
CO2: 26 mEq/L (ref 22–29)
Calcium: 9.8 mg/dL (ref 8.4–10.4)
Chloride: 105 mEq/L (ref 98–109)
Creatinine: 0.8 mg/dL (ref 0.6–1.1)
Glucose: 97 mg/dl (ref 70–140)
POTASSIUM: 4.6 meq/L (ref 3.5–5.1)
Sodium: 140 mEq/L (ref 136–145)
Total Bilirubin: 0.67 mg/dL (ref 0.20–1.20)
Total Protein: 7.1 g/dL (ref 6.4–8.3)

## 2013-09-14 LAB — TSH CHCC: TSH: 1.63 m(IU)/L (ref 0.308–3.960)

## 2013-09-14 LAB — T4, FREE: Free T4: 1.29 ng/dL (ref 0.80–1.80)

## 2013-09-14 LAB — LIPID PANEL
CHOL/HDL RATIO: 3.8 ratio
Cholesterol: 192 mg/dL (ref 0–200)
HDL: 51 mg/dL (ref >39)
LDL Cholesterol: 126 mg/dL — ABNORMAL HIGH (ref 0–99)
Triglycerides: 74 mg/dL (ref <150)
VLDL: 15 mg/dL (ref 0–40)

## 2013-09-14 MED ORDER — INFLUENZA VAC SPLIT QUAD 0.5 ML IM SUSP
0.5000 mL | INTRAMUSCULAR | Status: AC
Start: 2013-09-15 — End: 2013-09-14
  Administered 2013-09-14: 0.5 mL via INTRAMUSCULAR
  Filled 2013-09-14: qty 0.5

## 2013-09-14 NOTE — Progress Notes (Signed)
[place] OFFICE PROGRESS NOTE  Patient has no care team.  DIAGNOSIS: Nodular sclerosing Hodgkin lymphoma stage IIIB, no evidence of recurrence  SUMMARY OF ONCOLOGIC HISTORY: This is a pleasant 58 year old lady with diagnosis of lymphoma, discovered when she palpated a lump on the left side of the neck. Further imaging study at that time revealed mediastinal mass as well as cancer below the diaphragm. According to the patient, she was diagnosed with stage IIIB disease with associated weight loss. She was initially treated in 1987 with the ABVD regimen plus radiation therapy in a mantle field. She then relapsed in April 1990 and was treated with cis-platinum, VP-16, and CCNU alternating with ABVD hybrid regimen. She has been disease-free since   INTERVAL HISTORY: Shannon Kim 58 y.o. female returns for further followup. She denies any palpable lymphadenopathy. She is currently undergoing a lot of stress as she has recently lost her job. She denies any recent fever, chills, night sweats or abnormal weight loss  I have reviewed the past medical history, past surgical history, social history and family history with the patient and they are unchanged from previous note.  ALLERGIES:  is allergic to diphenhydramine hcl and penicillins.  MEDICATIONS:  Current Outpatient Prescriptions  Medication Sig Dispense Refill  . estradiol (VIVELLE-DOT) 0.025 MG/24HR Place 0.5 patches onto the skin once a week.       Marland Kitchen ibuprofen (ADVIL) 200 MG tablet Take 200 mg by mouth as needed.        Marland Kitchen levothyroxine (SYNTHROID) 125 MCG tablet Take 125 mcg by mouth daily before breakfast. Mondays thru Fridays      . progesterone (PROMETRIUM) 100 MG capsule Take 100 mg by mouth daily.        Marland Kitchen SYNTHROID 137 MCG tablet TAKE 1 TABLET BY MOUTH EVERY DAY ON SATURDAYS AND SUNDAYS ONLY  30 tablet  2   No current facility-administered medications for this visit.    REVIEW OF SYSTEMS:    Constitutional: Denies fevers, chills or abnormal weight loss Eyes: Denies blurriness of vision Ears, nose, mouth, throat, and face: Denies mucositis or sore throat Respiratory: Denies cough, dyspnea or wheezes Cardiovascular: Denies palpitation, chest discomfort or lower extremity swelling Gastrointestinal:  Denies nausea, heartburn or change in bowel habits Skin: Denies abnormal skin rashes Lymphatics: Denies new lymphadenopathy or easy bruising Neurological:Denies numbness, tingling or new weaknesses Behavioral/Psych: Mood is stable, no new changes  All other systems were reviewed with the patient and are negative.  PHYSICAL EXAMINATION: ECOG PERFORMANCE STATUS: 0 - Asymptomatic  Filed Vitals:   09/14/13 1147  BP: 171/101  Pulse: 102  Temp: 97.7 F (36.5 C)  Resp: 20   Filed Weights   09/14/13 1147  Weight: 185 lb 9.6 oz (84.188 kg)    GENERAL:alert, no distress and comfortable SKIN: skin color, texture, turgor are normal, no rashes or significant lesions EYES: normal, Conjunctiva are pink and non-injected, sclera clear OROPHARYNX:no exudate, no erythema and lips, buccal mucosa, and tongue normal  NECK: supple, thyroid normal size, non-tender, without nodularity LYMPH:  no palpable lymphadenopathy in the cervical, axillary or inguinal LUNGS: clear to auscultation and percussion with normal breathing effort HEART: regular rate & rhythm and no murmurs and no lower extremity edema ABDOMEN:abdomen soft, non-tender and normal bowel sounds Musculoskeletal:no cyanosis of digits and no clubbing  NEURO: alert & oriented x 3 with fluent speech, no focal motor/sensory deficits  LABORATORY DATA:  I have reviewed the data as listed    Component Value  Date/Time   NA 140 09/14/2013 1250   NA 140 12/29/2012 1028   K 4.6 09/14/2013 1250   K 4.9 12/29/2012 1028   CL 103 12/29/2012 1028   CO2 26 09/14/2013 1250   CO2 28 12/29/2012 1028   GLUCOSE 97 09/14/2013 1250   GLUCOSE 107* 12/29/2012  1028   BUN 21.5 09/14/2013 1250   BUN 18 12/29/2012 1028   CREATININE 0.8 09/14/2013 1250   CREATININE 0.96 12/29/2012 1028   CALCIUM 9.8 09/14/2013 1250   CALCIUM 9.6 12/29/2012 1028   PROT 7.1 09/14/2013 1250   PROT 7.0 12/29/2012 1028   ALBUMIN 4.1 09/14/2013 1250   ALBUMIN 3.8 12/29/2012 1028   AST 17 09/14/2013 1250   AST 17 12/29/2012 1028   ALT 18 09/14/2013 1250   ALT 16 12/29/2012 1028   ALKPHOS 75 09/14/2013 1250   ALKPHOS 82 12/29/2012 1028   BILITOT 0.67 09/14/2013 1250   BILITOT 0.6 12/29/2012 1028   GFRNONAA 64* 12/29/2012 1028   GFRAA 74* 12/29/2012 1028    No results found for this basename: SPEP, UPEP,  kappa and lambda light chains    Lab Results  Component Value Date   WBC 10.6* 09/14/2013   NEUTROABS 8.5* 09/14/2013   HGB 14.5 09/14/2013   HCT 43.9 09/14/2013   MCV 88.5 09/14/2013   PLT 301 09/14/2013      Chemistry      Component Value Date/Time   NA 140 09/14/2013 1250   NA 140 12/29/2012 1028   K 4.6 09/14/2013 1250   K 4.9 12/29/2012 1028   CL 103 12/29/2012 1028   CO2 26 09/14/2013 1250   CO2 28 12/29/2012 1028   BUN 21.5 09/14/2013 1250   BUN 18 12/29/2012 1028   CREATININE 0.8 09/14/2013 1250   CREATININE 0.96 12/29/2012 1028      Component Value Date/Time   CALCIUM 9.8 09/14/2013 1250   CALCIUM 9.6 12/29/2012 1028   ALKPHOS 75 09/14/2013 1250   ALKPHOS 82 12/29/2012 1028   AST 17 09/14/2013 1250   AST 17 12/29/2012 1028   ALT 18 09/14/2013 1250   ALT 16 12/29/2012 1028   BILITOT 0.67 09/14/2013 1250   BILITOT 0.6 12/29/2012 1028     ASSESSMENT & PLAN:  #1 history of Hodgkin lymphoma, no evidence of recurrence There is no benefit of ordering excess testing. I would not advocate imaging study unless the patient has signs and symptoms to suggest recurrence of disease. I will see her on a yearly basis. #2 hypothyroidism I will recheck a thyroid function test today. I recommend she simplify her regimen to levothyroxine 125 mcg daily. #3 hormone replacement therapy I recommend the patient to  initiate taper off her hormonal supplement #4 hyperlipidemia Her cholesterol level was mildly elevated. I recommend rechecking it today #5 preventive care We discussed the importance of preventive care and reviewed the vaccination programs. She does not have any prior allergic reactions to influenza vaccination. She agrees to proceed with influenza vaccination today and we will administer it today at the clinic. The patient is a cancer survivor. She would certainly be at risk of breast cancer due to her history of radiation therapy to her mediastinum and chest. With the current guidelines, I recommend mammogram on a yearly basis an MRI of her breast every other year. The patient would also be at risk of early coronary artery disease. I recommend aspirin therapy.  Orders Placed This Encounter  Procedures  . TSH    Standing  Status: Standing     Number of Occurrences: 2     Standing Expiration Date: 09/14/2014  . T4, free    Standing Status: Standing     Number of Occurrences: 2     Standing Expiration Date: 09/14/2014  . Lipid panel    Standing Status: Standing     Number of Occurrences: 2     Standing Expiration Date: 09/14/2014  . CBC with Differential    Standing Status: Standing     Number of Occurrences: 2     Standing Expiration Date: 09/14/2014  . Comprehensive metabolic panel    Standing Status: Standing     Number of Occurrences: 2     Standing Expiration Date: 09/14/2014   All questions were answered. The patient knows to call the clinic with any problems, questions or concerns. No barriers to learning was detected. I spent 25 minutes counseling the patient face to face. The total time spent in the appointment was 40 minutes and more than 50% was on counseling and review of test results     [facility], [place], MD 09/14/2013 1:51 PM

## 2013-09-14 NOTE — Telephone Encounter (Signed)
gv adn printed appt sched and avs forpt for Feb 2016....sent pt to lab

## 2013-09-15 ENCOUNTER — Telehealth: Payer: Self-pay | Admitting: *Deleted

## 2013-09-15 NOTE — Telephone Encounter (Signed)
Informed pt of lab results and Dr. Alvy Bimler recommends to take levothyroxine 125 mcg daily.  Pt states she is going to continue on the synthroid meds she has now until she runs out and then will call us for the refill when needed.

## 2013-09-15 NOTE — Telephone Encounter (Signed)
Message copied by Cathlean Cower on Thu Sep 15, 2013  9:55 AM ------      Message from: [facility], [place]: Thu Sep 15, 2013  8:59 AM      Regarding: lab result       Pls call pt, lipid panel and thyroid tests ok      She requested refill of thyroid med but I forgot to ask here where and whether her insurance would allow 90 days      I plan to modify her levothyroxine to 125 mcg daily instead of the complicated regimen she is taking now      OK to call in 90 days with 3 refills ------

## 2013-12-29 ENCOUNTER — Telehealth: Payer: Self-pay | Admitting: *Deleted

## 2013-12-29 ENCOUNTER — Other Ambulatory Visit: Payer: Self-pay | Admitting: Hematology and Oncology

## 2013-12-29 DIAGNOSIS — Z23 Encounter for immunization: Secondary | ICD-10-CM

## 2013-12-29 DIAGNOSIS — E785 Hyperlipidemia, unspecified: Secondary | ICD-10-CM

## 2013-12-29 DIAGNOSIS — E039 Hypothyroidism, unspecified: Secondary | ICD-10-CM

## 2013-12-29 DIAGNOSIS — C8190 Hodgkin lymphoma, unspecified, unspecified site: Secondary | ICD-10-CM

## 2013-12-29 MED ORDER — LEVOTHYROXINE SODIUM 125 MCG PO TABS: 125.0000 ug | ORAL_TABLET | Freq: Every day | ORAL | Status: AC

## 2013-12-29 NOTE — Telephone Encounter (Signed)
done

## 2013-12-29 NOTE — Telephone Encounter (Signed)
Pt requests refill on Synthroid 0.125 mg sent to [facility] on [address].

## 2013-12-29 NOTE — Telephone Encounter (Signed)
Informed pt of refill sent to [facility].

## 2014-02-13 ENCOUNTER — Other Ambulatory Visit: Payer: Self-pay

## 2014-02-13 DIAGNOSIS — Z1231 Encounter for screening mammogram for malignant neoplasm of breast: Secondary | ICD-10-CM

## 2014-02-20 ENCOUNTER — Ambulatory Visit
Admission: RE | Admit: 2014-02-20 | Discharge: 2014-02-20 | Disposition: A | Discharge status: Home / Self Care | Payer: 59 | Source: Ambulatory Visit

## 2014-02-20 DIAGNOSIS — Z1231 Encounter for screening mammogram for malignant neoplasm of breast: Secondary | ICD-10-CM

## 2014-09-08 ENCOUNTER — Telehealth: Payer: Self-pay | Admitting: Hematology and Oncology

## 2014-09-08 NOTE — Telephone Encounter (Signed)
pt called to r/s appt..done...pt aware of new d.t °

## 2014-09-08 NOTE — Telephone Encounter (Signed)
returned call and lvm for pt regarding to calling back to r/s appt

## 2014-09-14 ENCOUNTER — Ambulatory Visit: Payer: 59 | Admitting: Hematology and Oncology

## 2014-09-14 ENCOUNTER — Other Ambulatory Visit: Payer: 59

## 2014-10-06 ENCOUNTER — Telehealth: Payer: Self-pay | Admitting: Hematology and Oncology

## 2014-10-06 ENCOUNTER — Other Ambulatory Visit: Payer: Self-pay | Admitting: Hematology and Oncology

## 2014-10-06 NOTE — Telephone Encounter (Signed)
returned call and cx appt per pt request...she is going back to her pervious MD

## 2014-10-09 ENCOUNTER — Other Ambulatory Visit: Payer: Self-pay

## 2014-10-09 ENCOUNTER — Ambulatory Visit: Payer: Self-pay | Admitting: Hematology and Oncology

## 2014-11-06 DIAGNOSIS — J302 Other seasonal allergic rhinitis: Secondary | ICD-10-CM | POA: Insufficient documentation

## 2014-11-06 DIAGNOSIS — F4312 Post-traumatic stress disorder, chronic: Secondary | ICD-10-CM | POA: Insufficient documentation

## 2015-01-31 ENCOUNTER — Other Ambulatory Visit: Payer: Self-pay | Admitting: Hematology and Oncology

## 2015-03-23 ENCOUNTER — Other Ambulatory Visit: Payer: Self-pay

## 2015-03-23 DIAGNOSIS — Z1231 Encounter for screening mammogram for malignant neoplasm of breast: Secondary | ICD-10-CM

## 2015-03-30 ENCOUNTER — Ambulatory Visit: Payer: Self-pay

## 2015-04-04 ENCOUNTER — Other Ambulatory Visit: Payer: Self-pay | Admitting: Oncology

## 2015-04-04 DIAGNOSIS — C8190 Hodgkin lymphoma, unspecified, unspecified site: Secondary | ICD-10-CM

## 2015-04-04 DIAGNOSIS — Z923 Personal history of irradiation: Secondary | ICD-10-CM

## 2015-04-05 ENCOUNTER — Other Ambulatory Visit: Payer: Self-pay | Admitting: Oncology

## 2015-04-05 DIAGNOSIS — Z923 Personal history of irradiation: Secondary | ICD-10-CM

## 2015-04-05 DIAGNOSIS — C8190 Hodgkin lymphoma, unspecified, unspecified site: Secondary | ICD-10-CM

## 2015-04-11 ENCOUNTER — Ambulatory Visit
Admission: RE | Admit: 2015-04-11 | Discharge: 2015-04-11 | Disposition: A | Discharge status: Home / Self Care | Payer: 59 | Source: Ambulatory Visit | Attending: Oncology | Admitting: Oncology

## 2015-04-11 DIAGNOSIS — C8190 Hodgkin lymphoma, unspecified, unspecified site: Secondary | ICD-10-CM

## 2015-04-11 DIAGNOSIS — Z923 Personal history of irradiation: Secondary | ICD-10-CM

## 2015-04-11 MED ORDER — GADOBENATE DIMEGLUMINE 529 MG/ML IV SOLN
15.0000 mL | Freq: Once | INTRAVENOUS | Status: AC | PRN
  Administered 2015-04-11: 15 mL via INTRAVENOUS

## 2017-05-05 DIAGNOSIS — M7130 Other bursal cyst, unspecified site: Secondary | ICD-10-CM | POA: Diagnosis not present

## 2017-05-05 DIAGNOSIS — Z85828 Personal history of other malignant neoplasm of skin: Secondary | ICD-10-CM | POA: Diagnosis not present

## 2017-05-05 DIAGNOSIS — L821 Other seborrheic keratosis: Secondary | ICD-10-CM | POA: Diagnosis not present

## 2017-05-05 DIAGNOSIS — L910 Hypertrophic scar: Secondary | ICD-10-CM | POA: Diagnosis not present

## 2017-05-20 DIAGNOSIS — Z0000 Encounter for general adult medical examination without abnormal findings: Secondary | ICD-10-CM | POA: Diagnosis not present

## 2017-05-20 DIAGNOSIS — N390 Urinary tract infection, site not specified: Secondary | ICD-10-CM | POA: Diagnosis not present

## 2017-05-27 DIAGNOSIS — C8190 Hodgkin lymphoma, unspecified, unspecified site: Secondary | ICD-10-CM | POA: Diagnosis not present

## 2017-05-27 DIAGNOSIS — E039 Hypothyroidism, unspecified: Secondary | ICD-10-CM | POA: Diagnosis not present

## 2017-05-27 DIAGNOSIS — E559 Vitamin D deficiency, unspecified: Secondary | ICD-10-CM | POA: Diagnosis not present

## 2017-05-27 DIAGNOSIS — Z23 Encounter for immunization: Secondary | ICD-10-CM | POA: Diagnosis not present

## 2017-05-27 DIAGNOSIS — Z0000 Encounter for general adult medical examination without abnormal findings: Secondary | ICD-10-CM | POA: Diagnosis not present

## 2017-05-28 ENCOUNTER — Other Ambulatory Visit: Payer: Self-pay | Admitting: Internal Medicine

## 2017-06-10 ENCOUNTER — Other Ambulatory Visit: Payer: Self-pay | Admitting: Internal Medicine

## 2017-06-10 DIAGNOSIS — I4510 Unspecified right bundle-branch block: Secondary | ICD-10-CM | POA: Diagnosis not present

## 2017-06-10 DIAGNOSIS — Z9189 Other specified personal risk factors, not elsewhere classified: Secondary | ICD-10-CM

## 2017-06-10 DIAGNOSIS — I358 Other nonrheumatic aortic valve disorders: Secondary | ICD-10-CM | POA: Diagnosis not present

## 2017-06-10 DIAGNOSIS — C8190 Hodgkin lymphoma, unspecified, unspecified site: Secondary | ICD-10-CM

## 2017-06-10 DIAGNOSIS — Z8571 Personal history of Hodgkin lymphoma: Secondary | ICD-10-CM | POA: Diagnosis not present

## 2017-06-10 DIAGNOSIS — Z136 Encounter for screening for cardiovascular disorders: Secondary | ICD-10-CM | POA: Diagnosis not present

## 2017-06-27 ENCOUNTER — Other Ambulatory Visit: Payer: 59

## 2017-07-06 DIAGNOSIS — Z136 Encounter for screening for cardiovascular disorders: Secondary | ICD-10-CM | POA: Diagnosis not present

## 2017-07-07 DIAGNOSIS — Z6826 Body mass index (BMI) 26.0-26.9, adult: Secondary | ICD-10-CM | POA: Diagnosis not present

## 2017-07-07 DIAGNOSIS — Z1231 Encounter for screening mammogram for malignant neoplasm of breast: Secondary | ICD-10-CM | POA: Diagnosis not present

## 2017-07-07 DIAGNOSIS — Z01419 Encounter for gynecological examination (general) (routine) without abnormal findings: Secondary | ICD-10-CM | POA: Diagnosis not present

## 2017-07-10 ENCOUNTER — Other Ambulatory Visit: Payer: 59

## 2017-07-17 ENCOUNTER — Other Ambulatory Visit: Payer: BLUE CROSS/BLUE SHIELD

## 2017-08-15 ENCOUNTER — Ambulatory Visit
Admission: RE | Admit: 2017-08-15 | Discharge: 2017-08-15 | Disposition: A | Discharge status: Home / Self Care | Payer: BLUE CROSS/BLUE SHIELD | Source: Ambulatory Visit | Attending: Internal Medicine | Admitting: Internal Medicine

## 2017-08-15 DIAGNOSIS — Z853 Personal history of malignant neoplasm of breast: Secondary | ICD-10-CM | POA: Diagnosis not present

## 2017-08-15 DIAGNOSIS — C8190 Hodgkin lymphoma, unspecified, unspecified site: Secondary | ICD-10-CM

## 2017-08-15 DIAGNOSIS — Z9189 Other specified personal risk factors, not elsewhere classified: Secondary | ICD-10-CM

## 2017-08-15 MED ORDER — GADOBENATE DIMEGLUMINE 529 MG/ML IV SOLN
17.0000 mL | Freq: Once | INTRAVENOUS | Status: AC | PRN
  Administered 2017-08-15: 17 mL via INTRAVENOUS

## 2017-08-17 DIAGNOSIS — I358 Other nonrheumatic aortic valve disorders: Secondary | ICD-10-CM | POA: Diagnosis not present

## 2017-08-25 DIAGNOSIS — Z136 Encounter for screening for cardiovascular disorders: Secondary | ICD-10-CM | POA: Diagnosis not present

## 2017-08-25 DIAGNOSIS — R0989 Other specified symptoms and signs involving the circulatory and respiratory systems: Secondary | ICD-10-CM | POA: Diagnosis not present

## 2017-08-25 DIAGNOSIS — I358 Other nonrheumatic aortic valve disorders: Secondary | ICD-10-CM | POA: Diagnosis not present

## 2017-08-25 DIAGNOSIS — I4510 Unspecified right bundle-branch block: Secondary | ICD-10-CM | POA: Diagnosis not present

## 2017-08-27 DIAGNOSIS — E039 Hypothyroidism, unspecified: Secondary | ICD-10-CM | POA: Diagnosis not present

## 2017-08-27 DIAGNOSIS — E559 Vitamin D deficiency, unspecified: Secondary | ICD-10-CM | POA: Diagnosis not present

## 2017-08-27 DIAGNOSIS — E7800 Pure hypercholesterolemia, unspecified: Secondary | ICD-10-CM | POA: Diagnosis not present

## 2017-09-03 DIAGNOSIS — I10 Essential (primary) hypertension: Secondary | ICD-10-CM | POA: Diagnosis not present

## 2017-09-03 DIAGNOSIS — E039 Hypothyroidism, unspecified: Secondary | ICD-10-CM | POA: Diagnosis not present

## 2017-09-30 DIAGNOSIS — R0989 Other specified symptoms and signs involving the circulatory and respiratory systems: Secondary | ICD-10-CM | POA: Diagnosis not present

## 2018-02-25 DIAGNOSIS — E039 Hypothyroidism, unspecified: Secondary | ICD-10-CM | POA: Diagnosis not present

## 2018-02-25 DIAGNOSIS — I10 Essential (primary) hypertension: Secondary | ICD-10-CM | POA: Diagnosis not present

## 2018-02-25 DIAGNOSIS — N390 Urinary tract infection, site not specified: Secondary | ICD-10-CM | POA: Diagnosis not present

## 2018-03-04 DIAGNOSIS — E7800 Pure hypercholesterolemia, unspecified: Secondary | ICD-10-CM | POA: Diagnosis not present

## 2018-03-04 DIAGNOSIS — Z23 Encounter for immunization: Secondary | ICD-10-CM | POA: Diagnosis not present

## 2018-03-04 DIAGNOSIS — E039 Hypothyroidism, unspecified: Secondary | ICD-10-CM | POA: Diagnosis not present

## 2018-03-04 DIAGNOSIS — Z0000 Encounter for general adult medical examination without abnormal findings: Secondary | ICD-10-CM | POA: Diagnosis not present

## 2018-06-04 DIAGNOSIS — D485 Neoplasm of uncertain behavior of skin: Secondary | ICD-10-CM | POA: Diagnosis not present

## 2018-06-04 DIAGNOSIS — Z85828 Personal history of other malignant neoplasm of skin: Secondary | ICD-10-CM | POA: Diagnosis not present

## 2018-06-04 DIAGNOSIS — L309 Dermatitis, unspecified: Secondary | ICD-10-CM | POA: Diagnosis not present

## 2018-06-04 DIAGNOSIS — D045 Carcinoma in situ of skin of trunk: Secondary | ICD-10-CM | POA: Diagnosis not present

## 2018-06-04 DIAGNOSIS — L821 Other seborrheic keratosis: Secondary | ICD-10-CM | POA: Diagnosis not present

## 2018-06-04 DIAGNOSIS — L986 Other infiltrative disorders of the skin and subcutaneous tissue: Secondary | ICD-10-CM | POA: Diagnosis not present

## 2018-06-04 DIAGNOSIS — L812 Freckles: Secondary | ICD-10-CM | POA: Diagnosis not present

## 2018-06-24 DIAGNOSIS — H25812 Combined forms of age-related cataract, left eye: Secondary | ICD-10-CM | POA: Diagnosis not present

## 2018-08-30 DIAGNOSIS — E039 Hypothyroidism, unspecified: Secondary | ICD-10-CM | POA: Diagnosis not present

## 2018-08-30 DIAGNOSIS — Z0000 Encounter for general adult medical examination without abnormal findings: Secondary | ICD-10-CM | POA: Diagnosis not present

## 2018-08-30 DIAGNOSIS — E7800 Pure hypercholesterolemia, unspecified: Secondary | ICD-10-CM | POA: Diagnosis not present

## 2018-08-31 DIAGNOSIS — Z113 Encounter for screening for infections with a predominantly sexual mode of transmission: Secondary | ICD-10-CM | POA: Diagnosis not present

## 2018-08-31 DIAGNOSIS — Z6826 Body mass index (BMI) 26.0-26.9, adult: Secondary | ICD-10-CM | POA: Diagnosis not present

## 2018-08-31 DIAGNOSIS — Z1231 Encounter for screening mammogram for malignant neoplasm of breast: Secondary | ICD-10-CM | POA: Diagnosis not present

## 2018-08-31 DIAGNOSIS — Z01419 Encounter for gynecological examination (general) (routine) without abnormal findings: Secondary | ICD-10-CM | POA: Diagnosis not present

## 2018-08-31 DIAGNOSIS — Z1151 Encounter for screening for human papillomavirus (HPV): Secondary | ICD-10-CM | POA: Diagnosis not present

## 2018-09-01 DIAGNOSIS — H00025 Hordeolum internum left lower eyelid: Secondary | ICD-10-CM | POA: Diagnosis not present

## 2018-09-06 DIAGNOSIS — Z0000 Encounter for general adult medical examination without abnormal findings: Secondary | ICD-10-CM | POA: Diagnosis not present

## 2018-09-06 DIAGNOSIS — E039 Hypothyroidism, unspecified: Secondary | ICD-10-CM | POA: Diagnosis not present

## 2018-09-06 DIAGNOSIS — I10 Essential (primary) hypertension: Secondary | ICD-10-CM | POA: Diagnosis not present

## 2018-09-06 DIAGNOSIS — E7800 Pure hypercholesterolemia, unspecified: Secondary | ICD-10-CM | POA: Diagnosis not present

## 2019-05-10 ENCOUNTER — Telehealth: Payer: Self-pay

## 2019-05-10 NOTE — Telephone Encounter (Signed)
Pt called wanting to know if it is ok for her to have a colonoscopy. LS 09/2017 or does she need an appt.//ah

## 2019-05-10 NOTE — Telephone Encounter (Signed)
Not unless she is having active cardiac problems. It is a low risk procedure

## 2019-05-11 DIAGNOSIS — J302 Other seasonal allergic rhinitis: Secondary | ICD-10-CM | POA: Diagnosis not present

## 2019-05-11 NOTE — Telephone Encounter (Signed)
Pt aware.//ah

## 2019-05-16 ENCOUNTER — Ambulatory Visit (INDEPENDENT_AMBULATORY_CARE_PROVIDER_SITE_OTHER): Payer: BC Managed Care – PPO | Admitting: Podiatry

## 2019-05-16 ENCOUNTER — Other Ambulatory Visit: Payer: Self-pay

## 2019-05-16 ENCOUNTER — Ambulatory Visit (INDEPENDENT_AMBULATORY_CARE_PROVIDER_SITE_OTHER): Payer: BC Managed Care – PPO

## 2019-05-16 ENCOUNTER — Telehealth: Payer: Self-pay | Admitting: *Deleted

## 2019-05-16 DIAGNOSIS — M2041 Other hammer toe(s) (acquired), right foot: Secondary | ICD-10-CM | POA: Diagnosis not present

## 2019-05-16 DIAGNOSIS — M21619 Bunion of unspecified foot: Secondary | ICD-10-CM

## 2019-05-16 NOTE — Telephone Encounter (Signed)
"  I'm calling to schedule surgery with Dr. Amalia Hailey.  I had seen him this morning.  I wanted to see if I could schedule it for January 14.  He said the beginning of next year would be good.  Give me a call back."

## 2019-05-18 NOTE — Telephone Encounter (Signed)
I'm returning your call.  You want to schedule your surgery for August 25, 2019?  "Yes, I do."  I will get it scheduled, that date is available.  "He said he wants me to come back in to see him before the surgery date.  Can you transfer me to the appointment schedulers so I can schedule that?"  Yes, I will transfer you.  I transferred her to [place].

## 2019-05-19 DIAGNOSIS — Z1211 Encounter for screening for malignant neoplasm of colon: Secondary | ICD-10-CM | POA: Diagnosis not present

## 2019-05-19 DIAGNOSIS — K5730 Diverticulosis of large intestine without perforation or abscess without bleeding: Secondary | ICD-10-CM | POA: Diagnosis not present

## 2019-05-19 DIAGNOSIS — Z86010 Personal history of colonic polyps: Secondary | ICD-10-CM | POA: Diagnosis not present

## 2019-05-19 NOTE — Progress Notes (Signed)
   Subjective: 63 y.o. female presenting today as a new patient with a chief complaint of a painful bunion of the right foot that has been present for the past five years. Walking for long periods of time increases the pain.  She also reports a painful "bump" to the lateral aspect of the right fifth digit. Wearing shoes increases the pain. She has been taking OTC pain medication and tries to stay off her feet when possible for treatment.  She also complains of a hammertoe of the right 2nd digit that has been present for the past two years. She denies modifying factors regarding this toe. Patient is here for further evaluation and treatment.   Past Medical History:  Diagnosis Date  . Hodgkin lymphoma 09/14/2013  . Hodgkin's disease   . Hyperlipidemia 09/14/2013  . Hypothyroidism 09/14/2013  . Needs flu shot 09/14/2013  . Thyroid disease       Objective: Physical Exam General: The patient is alert and oriented x3 in no acute distress.  Dermatology: Skin is cool, dry and supple bilateral lower extremities. Negative for open lesions or macerations.  Vascular: Palpable pedal pulses bilaterally. No edema or erythema noted. Capillary refill within normal limits.  Neurological: Epicritic and protective threshold grossly intact bilaterally.   Musculoskeletal Exam: Clinical evidence of bunion deformity noted to the respective foot. There is moderate pain on palpation range of motion of the first MPJ. Lateral deviation of the hallux noted consistent with hallux abductovalgus. Hammertoe contracture also noted on clinical exam to the 2nd digit of the right foot. Symptomatic pain on palpation and range of motion also noted to the metatarsal phalangeal joints of the respective hammertoe digits.   Clinical evidence of Tailor's bunion deformity noted to the respective foot. There is a moderate pain on palpation range of motion of the fifth MPJ.   Radiographic Exam: Increased intermetatarsal angle greater than  15 with a hallux abductus angle greater than 30 noted on AP view. Moderate degenerative changes noted within the first MPJ. Contracture deformity also noted to the interphalangeal joints and MPJs of the digits of the respective hammertoes. Increased intermetatarsal angle to the fourth interspace of the respective foot. Prominent fifth metatarsal head. Joint spaces preserved.   Assessment: 1. HAV w/ bunion deformity right 2. Hammertoe deformity 2nd digit right 3. Tailor's bunion right     Plan of Care:  1. Patient was evaluated. X-Rays reviewed. 2. Today we discussed the conservative versus surgical management of the presenting pathology. The patient opts for surgical management. All possible complications and details of the procedure were explained. All patient questions were answered. No guarantees were expressed or implied. 3. Authorization for surgery was initiated today. Surgery will consist of bunionectomy with osteotomy right; hammertoe repair with capsulotomy 2nd digit right; Tailor's bunionectomy with osteotomy right.  4. Patient wants surgery in January 2021.  5. Return to clinic one week prior to review surgery again.     Edrick Kins, DPM [facility]  Dr. Edrick Kins, [place]                                        [place], [place] [address]                Office [phone]  Fax [phone]

## 2019-06-14 DIAGNOSIS — L814 Other melanin hyperpigmentation: Secondary | ICD-10-CM | POA: Diagnosis not present

## 2019-06-14 DIAGNOSIS — Z85828 Personal history of other malignant neoplasm of skin: Secondary | ICD-10-CM | POA: Diagnosis not present

## 2019-06-14 DIAGNOSIS — D225 Melanocytic nevi of trunk: Secondary | ICD-10-CM | POA: Diagnosis not present

## 2019-06-14 DIAGNOSIS — L570 Actinic keratosis: Secondary | ICD-10-CM | POA: Diagnosis not present

## 2019-06-14 DIAGNOSIS — L821 Other seborrheic keratosis: Secondary | ICD-10-CM | POA: Diagnosis not present

## 2019-07-18 DIAGNOSIS — H43811 Vitreous degeneration, right eye: Secondary | ICD-10-CM | POA: Diagnosis not present

## 2019-07-18 DIAGNOSIS — H43391 Other vitreous opacities, right eye: Secondary | ICD-10-CM | POA: Diagnosis not present

## 2019-08-02 DIAGNOSIS — Z03818 Encounter for observation for suspected exposure to other biological agents ruled out: Secondary | ICD-10-CM | POA: Diagnosis not present

## 2019-08-17 ENCOUNTER — Ambulatory Visit (INDEPENDENT_AMBULATORY_CARE_PROVIDER_SITE_OTHER): Payer: BC Managed Care – PPO | Admitting: Podiatry

## 2019-08-17 ENCOUNTER — Other Ambulatory Visit: Payer: Self-pay

## 2019-08-17 DIAGNOSIS — M21619 Bunion of unspecified foot: Secondary | ICD-10-CM

## 2019-08-17 DIAGNOSIS — M2041 Other hammer toe(s) (acquired), right foot: Secondary | ICD-10-CM

## 2019-08-19 ENCOUNTER — Telehealth: Payer: Self-pay | Admitting: *Deleted

## 2019-08-19 NOTE — Telephone Encounter (Signed)
DOS 08/25/2019 DOUBLE OSTEOTOMY -  28299, METATARSAL OSTEOTOMY 5TH - 28308, CAPSULOTOMY MPJ RELEASE JOINT 2ND - 91478, AND HAMMER TOE REPAIR 2ND - 29562  BCBS: Eligibility Date - 08/12/2019 - 08/10/2198   In-Network    Max Per Benefit Period Year-to-Date Remaining  CoInsurance  20%    Deductible  $3000.00 $3000.00  Out-Of-Pocket 3  $4500.00 $4500.00    In Network  [employer] Coinsurance Authorization Required  Not Applicable  123456 per Service Year  No

## 2019-08-22 NOTE — Progress Notes (Signed)
   Subjective: 64 y.o. female presenting today for follow up evaluation of a Tailor's bunion, 2nd digit hammertoe and a bunion of the right foot. She states her pain is unchanged. Walking increases the pain. She has been treating the symptoms conservatively and is here to discuss the surgery she is scheduled for on 08/25/2019. Patient is here for further evaluation and treatment.   Past Medical History:  Diagnosis Date  . Hodgkin lymphoma 09/14/2013  . Hodgkin's disease   . Hyperlipidemia 09/14/2013  . Hypothyroidism 09/14/2013  . Needs flu shot 09/14/2013  . Thyroid disease       Objective: Physical Exam General: The patient is alert and oriented x3 in no acute distress.  Dermatology: Skin is cool, dry and supple bilateral lower extremities. Negative for open lesions or macerations.  Vascular: Palpable pedal pulses bilaterally. No edema or erythema noted. Capillary refill within normal limits.  Neurological: Epicritic and protective threshold grossly intact bilaterally.   Musculoskeletal Exam: Clinical evidence of bunion deformity noted to the respective foot. There is moderate pain on palpation range of motion of the first MPJ. Lateral deviation of the hallux noted consistent with hallux abductovalgus. Hammertoe contracture also noted on clinical exam to the 2nd digit of the right foot. Symptomatic pain on palpation and range of motion also noted to the metatarsal phalangeal joints of the respective hammertoe digits.   Clinical evidence of Tailor's bunion deformity noted to the respective foot. There is a moderate pain on palpation range of motion of the fifth MPJ.    Assessment: 1. HAV w/ bunion deformity right 2. Hammertoe deformity 2nd digit right 3. Tailor's bunion right     Plan of Care:  1. Patient was evaluated.  2. Surgery scheduled for 08/25/2019.  3. Authorization for surgery was initiated today. Surgery will consist of bunionectomy with osteotomy right; hammertoe repair with  capsulotomy 2nd digit right; Tailor's bunionectomy with osteotomy right.  4. Return to clinic one week post op.     Edrick Kins, DPM [facility]  Dr. Edrick Kins, [place]                                        [place], [place] [address]                Office [phone]  Fax [phone]

## 2019-08-25 ENCOUNTER — Encounter: Payer: Self-pay | Admitting: Podiatry

## 2019-08-25 ENCOUNTER — Other Ambulatory Visit: Payer: Self-pay | Admitting: Podiatry

## 2019-08-25 DIAGNOSIS — M21611 Bunion of right foot: Secondary | ICD-10-CM | POA: Diagnosis not present

## 2019-08-25 DIAGNOSIS — M216X1 Other acquired deformities of right foot: Secondary | ICD-10-CM | POA: Diagnosis not present

## 2019-08-25 DIAGNOSIS — E7800 Pure hypercholesterolemia, unspecified: Secondary | ICD-10-CM | POA: Diagnosis not present

## 2019-08-25 DIAGNOSIS — M2041 Other hammer toe(s) (acquired), right foot: Secondary | ICD-10-CM | POA: Diagnosis not present

## 2019-08-25 DIAGNOSIS — M2011 Hallux valgus (acquired), right foot: Secondary | ICD-10-CM | POA: Diagnosis not present

## 2019-08-25 DIAGNOSIS — M21541 Acquired clubfoot, right foot: Secondary | ICD-10-CM | POA: Diagnosis not present

## 2019-08-25 DIAGNOSIS — M21621 Bunionette of right foot: Secondary | ICD-10-CM | POA: Diagnosis not present

## 2019-08-25 DIAGNOSIS — M25571 Pain in right ankle and joints of right foot: Secondary | ICD-10-CM | POA: Diagnosis not present

## 2019-08-25 MED ORDER — OXYCODONE-ACETAMINOPHEN 5-325 MG PO TABS: 1.0000 | ORAL_TABLET | Freq: Four times a day (QID) | ORAL | 0 refills | Status: AC | PRN

## 2019-08-25 NOTE — Progress Notes (Signed)
PRN postop 

## 2019-08-29 ENCOUNTER — Ambulatory Visit (INDEPENDENT_AMBULATORY_CARE_PROVIDER_SITE_OTHER): Payer: BC Managed Care – PPO

## 2019-08-29 ENCOUNTER — Other Ambulatory Visit: Payer: Self-pay

## 2019-08-29 ENCOUNTER — Ambulatory Visit (INDEPENDENT_AMBULATORY_CARE_PROVIDER_SITE_OTHER): Payer: BC Managed Care – PPO | Admitting: Podiatry

## 2019-08-29 DIAGNOSIS — M2041 Other hammer toe(s) (acquired), right foot: Secondary | ICD-10-CM

## 2019-08-29 DIAGNOSIS — Z9889 Other specified postprocedural states: Secondary | ICD-10-CM

## 2019-08-29 DIAGNOSIS — M21619 Bunion of unspecified foot: Secondary | ICD-10-CM

## 2019-09-01 NOTE — Progress Notes (Signed)
   Subjective:  Patient presents today status post right foot surgery. DOS: 08/25/19. She states she is doing well. She denies significant pain or modifying factors. She reports some associated drainage from the incision site. She has been using the CAM boot as directed. Patient is here for further evaluation and treatment.    Past Medical History:  Diagnosis Date  . Hodgkin lymphoma 09/14/2013  . Hodgkin's disease   . Hyperlipidemia 09/14/2013  . Hypothyroidism 09/14/2013  . Needs flu shot 09/14/2013  . Thyroid disease       Objective/Physical Exam Neurovascular status intact.  Skin incisions appear to be well coapted with sutures and staples intact. No sign of infectious process noted. No dehiscence. No active bleeding noted. Moderate edema noted to the surgical extremity.  Radiographic Exam:  Orthopedic hardware and osteotomies sites appear to be stable with routine healing.  Assessment: 1. s/p right foot surgery. DOS: 08/25/19   Plan of Care:  1. Patient was evaluated. X-rays reviewed 2. Dressing changed. Keep clean, dry and intact for one week.  3. Continue weightbearing in CAM boot with walker.  4. Return to clinic in one week.    Edrick Kins, DPM [facility]  Dr. Edrick Kins, [address]                                        [place], [place] [address]                Office [phone]  Fax [phone]

## 2019-09-07 ENCOUNTER — Other Ambulatory Visit: Payer: Self-pay

## 2019-09-07 ENCOUNTER — Ambulatory Visit (INDEPENDENT_AMBULATORY_CARE_PROVIDER_SITE_OTHER): Payer: BC Managed Care – PPO | Admitting: Podiatry

## 2019-09-07 DIAGNOSIS — M2041 Other hammer toe(s) (acquired), right foot: Secondary | ICD-10-CM

## 2019-09-07 DIAGNOSIS — M21619 Bunion of unspecified foot: Secondary | ICD-10-CM

## 2019-09-07 DIAGNOSIS — Z9889 Other specified postprocedural states: Secondary | ICD-10-CM

## 2019-09-10 NOTE — Progress Notes (Signed)
   Subjective:  Patient presents today status post right foot surgery. DOS: 08/25/19. She states she is doing well. She denies any pain or modifying factors. She has been using the CAM boot as directed. Patient is here for further evaluation and treatment.    Past Medical History:  Diagnosis Date  . Hodgkin lymphoma 09/14/2013  . Hodgkin's disease   . Hyperlipidemia 09/14/2013  . Hypothyroidism 09/14/2013  . Needs flu shot 09/14/2013  . Thyroid disease       Objective/Physical Exam Neurovascular status intact.  Skin incisions appear to be well coapted with sutures and staples intact. No sign of infectious process noted. No dehiscence. No active bleeding noted. Moderate edema noted to the surgical extremity.  Assessment: 1. s/p right foot surgery. DOS: 08/25/19   Plan of Care:  1. Patient was evaluated. 2. Dressing changed. Keep clean, dry and intact for one week.  3. Continue weightbearing in CAM boot.  4. Return to clinic in one week.   Edrick Kins, DPM [facility]  Dr. Edrick Kins, [address]                                        [place], [place] [address]                Office [phone]  Fax [phone]

## 2019-09-14 ENCOUNTER — Other Ambulatory Visit: Payer: Self-pay

## 2019-09-14 ENCOUNTER — Ambulatory Visit (INDEPENDENT_AMBULATORY_CARE_PROVIDER_SITE_OTHER): Payer: BC Managed Care – PPO | Admitting: Podiatry

## 2019-09-14 DIAGNOSIS — M21619 Bunion of unspecified foot: Secondary | ICD-10-CM

## 2019-09-14 DIAGNOSIS — M2041 Other hammer toe(s) (acquired), right foot: Secondary | ICD-10-CM

## 2019-09-14 DIAGNOSIS — Z9889 Other specified postprocedural states: Secondary | ICD-10-CM

## 2019-09-18 NOTE — Progress Notes (Signed)
   Subjective:  Patient presents today status post right foot surgery. DOS: 08/25/19. She states she is doing well. She denies any significant pain or modifying factors. She has been using the CAM boot as directed. Patient is here for further evaluation and treatment.    Past Medical History:  Diagnosis Date  . Hodgkin lymphoma 09/14/2013  . Hodgkin's disease   . Hyperlipidemia 09/14/2013  . Hypothyroidism 09/14/2013  . Needs flu shot 09/14/2013  . Thyroid disease       Objective/Physical Exam Neurovascular status intact.  Skin incisions appear to be well coapted with sutures and staples intact. No sign of infectious process noted. No dehiscence. No active bleeding noted. Moderate edema noted to the surgical extremity.  Assessment: 1. s/p right foot surgery. DOS: 08/25/19   Plan of Care:  1. Patient was evaluated. 2. Sutures removed.  3. Continue using CAM boot.  4. Return to clinic in one week for percutaneous pin removal.    Edrick Kins, DPM [facility]  Dr. Edrick Kins, [address]                                        [place], [place] [address]                Office [phone]  Fax [phone]

## 2019-09-21 ENCOUNTER — Ambulatory Visit (INDEPENDENT_AMBULATORY_CARE_PROVIDER_SITE_OTHER): Payer: BC Managed Care – PPO | Admitting: Podiatry

## 2019-09-21 ENCOUNTER — Other Ambulatory Visit: Payer: Self-pay

## 2019-09-21 DIAGNOSIS — Z9889 Other specified postprocedural states: Secondary | ICD-10-CM

## 2019-09-21 DIAGNOSIS — M2041 Other hammer toe(s) (acquired), right foot: Secondary | ICD-10-CM

## 2019-09-21 DIAGNOSIS — M21619 Bunion of unspecified foot: Secondary | ICD-10-CM

## 2019-09-23 DIAGNOSIS — E039 Hypothyroidism, unspecified: Secondary | ICD-10-CM | POA: Diagnosis not present

## 2019-09-23 DIAGNOSIS — Z0000 Encounter for general adult medical examination without abnormal findings: Secondary | ICD-10-CM | POA: Diagnosis not present

## 2019-09-23 DIAGNOSIS — E7800 Pure hypercholesterolemia, unspecified: Secondary | ICD-10-CM | POA: Diagnosis not present

## 2019-09-26 DIAGNOSIS — E7800 Pure hypercholesterolemia, unspecified: Secondary | ICD-10-CM | POA: Diagnosis not present

## 2019-09-26 DIAGNOSIS — H6123 Impacted cerumen, bilateral: Secondary | ICD-10-CM | POA: Diagnosis not present

## 2019-09-26 DIAGNOSIS — R0989 Other specified symptoms and signs involving the circulatory and respiratory systems: Secondary | ICD-10-CM | POA: Diagnosis not present

## 2019-09-26 DIAGNOSIS — I10 Essential (primary) hypertension: Secondary | ICD-10-CM | POA: Diagnosis not present

## 2019-09-26 DIAGNOSIS — Z0000 Encounter for general adult medical examination without abnormal findings: Secondary | ICD-10-CM | POA: Diagnosis not present

## 2019-09-26 NOTE — Progress Notes (Signed)
   Subjective:  Patient presents today status post right foot surgery. DOS: 08/25/19. She states she is doing well. She reports some swelling of the foot but denies any pain. She has been using the CAM boot as directed. She denies any worsening factors at this time. Patient is here for further evaluation and treatment.    Past Medical History:  Diagnosis Date  . Hodgkin lymphoma 09/14/2013  . Hodgkin's disease   . Hyperlipidemia 09/14/2013  . Hypothyroidism 09/14/2013  . Needs flu shot 09/14/2013  . Thyroid disease       Objective/Physical Exam Neurovascular status intact.  Skin incisions appear to be well coapted. No sign of infectious process noted. No dehiscence. No active bleeding noted. Moderate edema noted to the surgical extremity.  Assessment: 1. s/p right foot surgery. DOS: 08/25/19   Plan of Care:  1. Patient was evaluated. 2. Percutaneous pin removed.  3. Discontinue using CAM boot.  4. Post op shoe dispensed.  5. Physical therapy ordered from [facility].  6. Return to clinic in 4 weeks.    Edrick Kins, DPM [facility]  Dr. Edrick Kins, [address]                                        [place], [place] [address]                Office [phone]  Fax [phone]

## 2019-09-28 DIAGNOSIS — M25674 Stiffness of right foot, not elsewhere classified: Secondary | ICD-10-CM | POA: Diagnosis not present

## 2019-09-28 DIAGNOSIS — M25671 Stiffness of right ankle, not elsewhere classified: Secondary | ICD-10-CM | POA: Diagnosis not present

## 2019-09-28 DIAGNOSIS — R262 Difficulty in walking, not elsewhere classified: Secondary | ICD-10-CM | POA: Diagnosis not present

## 2019-09-28 DIAGNOSIS — M25474 Effusion, right foot: Secondary | ICD-10-CM | POA: Diagnosis not present

## 2019-10-03 DIAGNOSIS — M25474 Effusion, right foot: Secondary | ICD-10-CM | POA: Diagnosis not present

## 2019-10-03 DIAGNOSIS — R262 Difficulty in walking, not elsewhere classified: Secondary | ICD-10-CM | POA: Diagnosis not present

## 2019-10-03 DIAGNOSIS — M25674 Stiffness of right foot, not elsewhere classified: Secondary | ICD-10-CM | POA: Diagnosis not present

## 2019-10-03 DIAGNOSIS — M25671 Stiffness of right ankle, not elsewhere classified: Secondary | ICD-10-CM | POA: Diagnosis not present

## 2019-10-05 DIAGNOSIS — M25674 Stiffness of right foot, not elsewhere classified: Secondary | ICD-10-CM | POA: Diagnosis not present

## 2019-10-05 DIAGNOSIS — R262 Difficulty in walking, not elsewhere classified: Secondary | ICD-10-CM | POA: Diagnosis not present

## 2019-10-05 DIAGNOSIS — M25474 Effusion, right foot: Secondary | ICD-10-CM | POA: Diagnosis not present

## 2019-10-05 DIAGNOSIS — M25671 Stiffness of right ankle, not elsewhere classified: Secondary | ICD-10-CM | POA: Diagnosis not present

## 2019-10-14 ENCOUNTER — Other Ambulatory Visit: Payer: Self-pay | Admitting: Obstetrics and Gynecology

## 2019-10-14 DIAGNOSIS — R928 Other abnormal and inconclusive findings on diagnostic imaging of breast: Secondary | ICD-10-CM

## 2019-10-17 ENCOUNTER — Other Ambulatory Visit: Payer: Self-pay | Admitting: Obstetrics and Gynecology

## 2019-10-17 DIAGNOSIS — E2839 Other primary ovarian failure: Secondary | ICD-10-CM

## 2019-10-19 ENCOUNTER — Ambulatory Visit
Admission: RE | Admit: 2019-10-19 | Discharge: 2019-10-19 | Disposition: A | Discharge status: Home / Self Care | Payer: Medicare Other | Source: Ambulatory Visit | Attending: Obstetrics and Gynecology | Admitting: Obstetrics and Gynecology

## 2019-10-19 ENCOUNTER — Other Ambulatory Visit: Payer: Self-pay

## 2019-10-19 ENCOUNTER — Ambulatory Visit (INDEPENDENT_AMBULATORY_CARE_PROVIDER_SITE_OTHER): Payer: Medicare Other | Admitting: Podiatry

## 2019-10-19 ENCOUNTER — Ambulatory Visit (INDEPENDENT_AMBULATORY_CARE_PROVIDER_SITE_OTHER): Payer: Medicare Other

## 2019-10-19 DIAGNOSIS — M2041 Other hammer toe(s) (acquired), right foot: Secondary | ICD-10-CM

## 2019-10-19 DIAGNOSIS — Z9889 Other specified postprocedural states: Secondary | ICD-10-CM

## 2019-10-19 DIAGNOSIS — R928 Other abnormal and inconclusive findings on diagnostic imaging of breast: Secondary | ICD-10-CM

## 2019-10-23 NOTE — Progress Notes (Signed)
   Subjective:  Patient presents today status post right foot surgery. DOS: 08/25/19. She states she is doing well. She reports some mild intermittent swelling. She has been using the post op shoe as directed. She denies any pain. Patient is here for further evaluation and treatment.    Past Medical History:  Diagnosis Date  . Hodgkin lymphoma ([place]) 09/14/2013  . Hodgkin's disease   . Hyperlipidemia 09/14/2013  . Hypothyroidism 09/14/2013  . Needs flu shot 09/14/2013  . Thyroid disease       Objective/Physical Exam Neurovascular status intact.  Skin incisions appear to be well coapted. No sign of infectious process noted. No dehiscence. No active bleeding noted. Moderate edema noted to the surgical extremity.  Radiographic Exam:  Orthopedic hardware and osteotomies sites appear to be stable with routine healing.  Assessment: 1. s/p right foot surgery. DOS: 08/25/19   Plan of Care:  1. Patient was evaluated. X-Rays reviewed.  2. Transition into good sneakers.  3. Gradually increase activity.  4. Discontinue physical therapy.  5. Return to clinic in 6 weeks.     Edrick Kins, DPM [facility]  Dr. Edrick Kins, [address]                                        [place],  [address]                Office [phone]  Fax [phone]

## 2019-10-28 ENCOUNTER — Other Ambulatory Visit: Payer: Self-pay

## 2019-11-30 ENCOUNTER — Ambulatory Visit (INDEPENDENT_AMBULATORY_CARE_PROVIDER_SITE_OTHER): Payer: Medicare Other | Admitting: Podiatry

## 2019-11-30 ENCOUNTER — Other Ambulatory Visit: Payer: Self-pay

## 2019-11-30 ENCOUNTER — Ambulatory Visit (INDEPENDENT_AMBULATORY_CARE_PROVIDER_SITE_OTHER): Payer: Medicare Other

## 2019-11-30 VITALS — Temp 97.3°F

## 2019-11-30 DIAGNOSIS — M2041 Other hammer toe(s) (acquired), right foot: Secondary | ICD-10-CM | POA: Diagnosis not present

## 2019-11-30 DIAGNOSIS — M21619 Bunion of unspecified foot: Secondary | ICD-10-CM

## 2019-11-30 DIAGNOSIS — Z9889 Other specified postprocedural states: Secondary | ICD-10-CM

## 2019-12-05 NOTE — Progress Notes (Signed)
   Subjective:  Patient presents today status post right foot surgery. DOS: 08/25/19. She states she is doing well. She reports mild pain only if she walks for long periods of time. She has been wearing good shoes as instructed. Patient is here for further evaluation and treatment.    Past Medical History:  Diagnosis Date  . Hodgkin lymphoma ([place]) 09/14/2013  . Hodgkin's disease   . Hyperlipidemia 09/14/2013  . Hypothyroidism 09/14/2013  . Needs flu shot 09/14/2013  . Thyroid disease       Objective/Physical Exam Neurovascular status intact.  Skin incisions appear to be well coapted. No sign of infectious process noted. No dehiscence. No active bleeding noted. Moderate edema noted to the surgical extremity.  Radiographic Exam:  Callus formation with mild displacement of screws noted. Appear stable and intact.   Assessment: 1. s/p right foot surgery. DOS: 08/25/19   Plan of Care:  1. Patient was evaluated. X-Rays reviewed.  2. May resume full activity with no restrictions.  3. Recommended good shoe gear.  4. Return to clinic as needed.     Edrick Kins, DPM [facility]  Dr. Edrick Kins, [address]                                        [place], [place] [address]                Office [phone]  Fax [phone]

## 2020-01-02 ENCOUNTER — Other Ambulatory Visit: Payer: Self-pay

## 2020-01-02 ENCOUNTER — Ambulatory Visit
Admission: RE | Admit: 2020-01-02 | Discharge: 2020-01-02 | Disposition: A | Discharge status: Home / Self Care | Payer: Medicare Other | Source: Ambulatory Visit | Attending: Obstetrics and Gynecology | Admitting: Obstetrics and Gynecology

## 2020-01-02 DIAGNOSIS — E2839 Other primary ovarian failure: Secondary | ICD-10-CM

## 2020-03-02 ENCOUNTER — Other Ambulatory Visit: Payer: Self-pay | Admitting: Obstetrics and Gynecology

## 2020-03-15 ENCOUNTER — Other Ambulatory Visit: Payer: Self-pay | Admitting: Obstetrics and Gynecology

## 2020-03-15 DIAGNOSIS — Z8571 Personal history of Hodgkin lymphoma: Secondary | ICD-10-CM

## 2020-04-18 ENCOUNTER — Other Ambulatory Visit: Payer: Self-pay

## 2020-04-18 ENCOUNTER — Ambulatory Visit
Admission: RE | Admit: 2020-04-18 | Discharge: 2020-04-18 | Disposition: A | Discharge status: Home / Self Care | Payer: Medicare Other | Source: Ambulatory Visit | Attending: Obstetrics and Gynecology | Admitting: Obstetrics and Gynecology

## 2020-04-18 DIAGNOSIS — Z8571 Personal history of Hodgkin lymphoma: Secondary | ICD-10-CM

## 2020-04-18 MED ORDER — GADOBUTROL 1 MMOL/ML IV SOLN
8.0000 mL | Freq: Once | INTRAVENOUS | Status: AC | PRN
  Administered 2020-04-18: 8 mL via INTRAVENOUS

## 2021-12-04 IMAGING — US US BREAST*L* LIMITED INC AXILLA
1 series · 5 of 5 positions shown · non-contrast
Comparison: Previous exam(s).

CLINICAL DATA: Patient was called back from screening mammogram for
a left breast mass.

EXAM:
ULTRASOUND OF THE LEFT BREAST

[Series 1: us breast*left* limited inc axilla · 0.06mm/px · 5 of 5 slices shown]
[im 1/5]
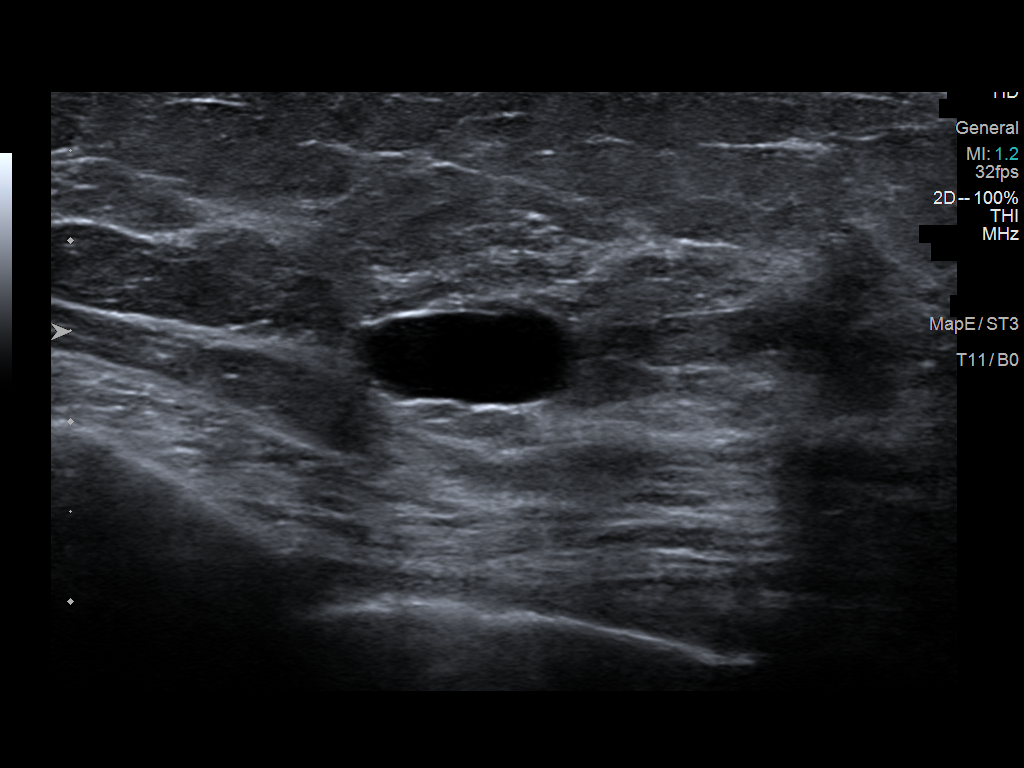
[im 2/5]
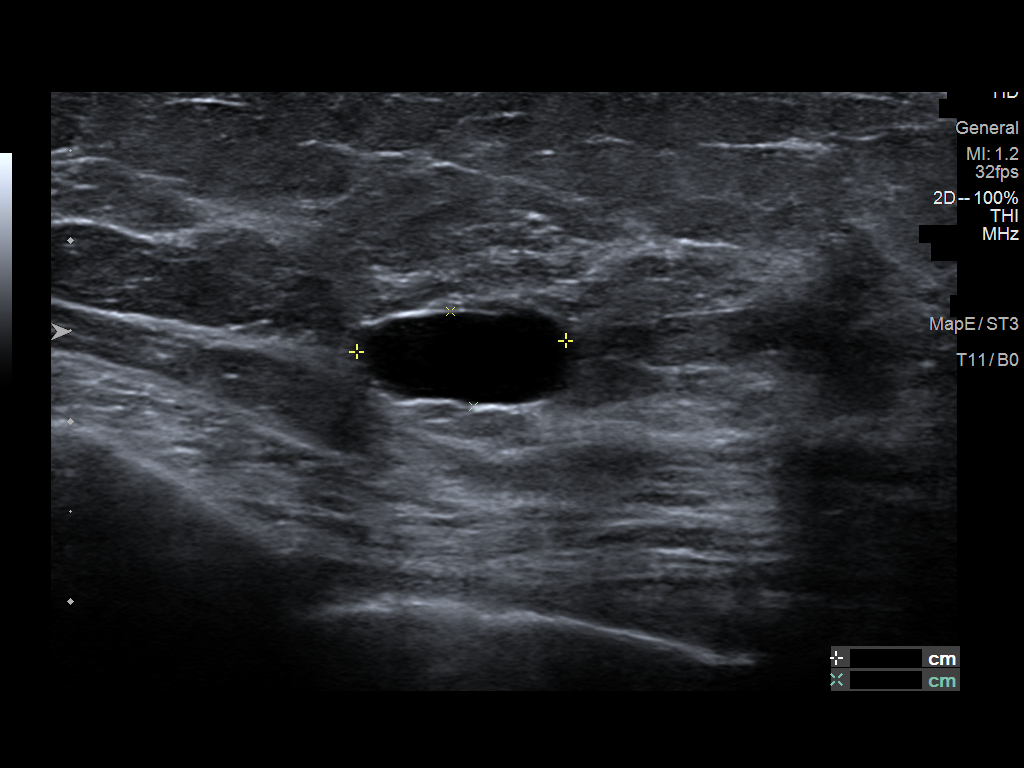
[im 3/5]
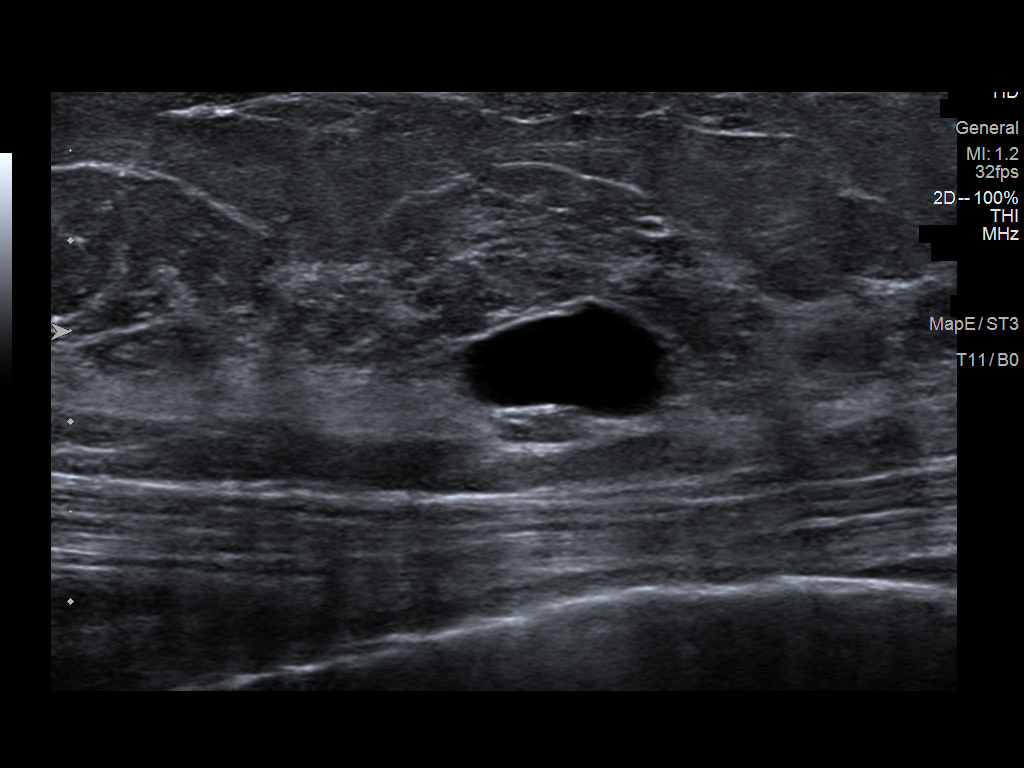
[im 4/5]
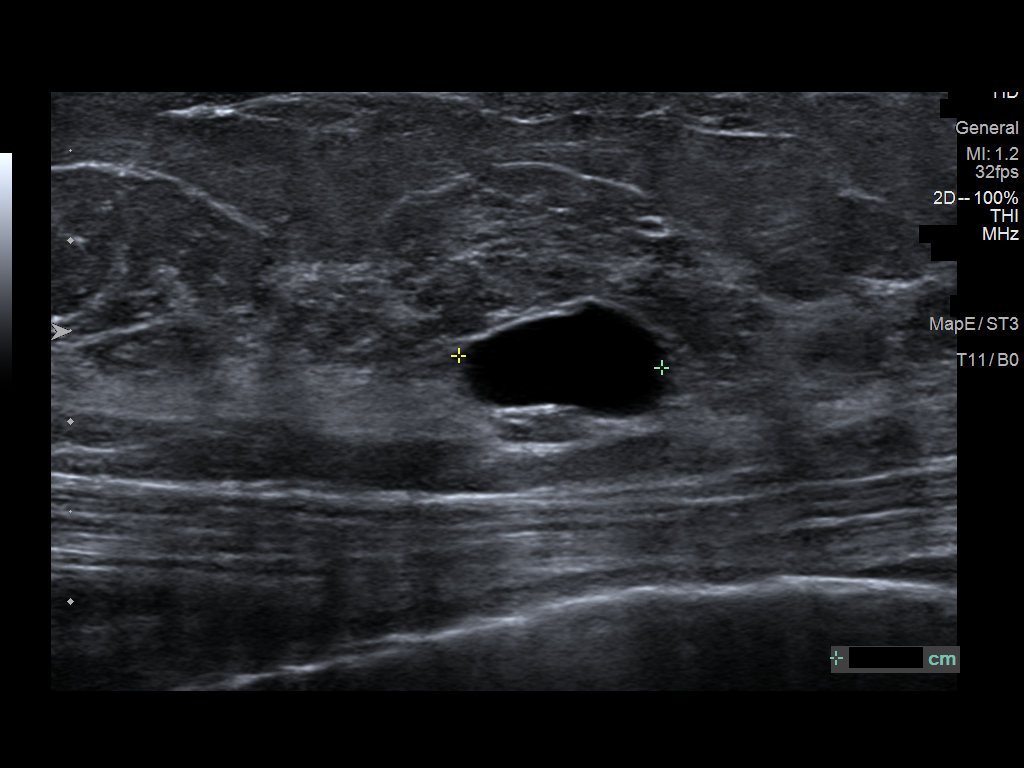
[im 5/5]
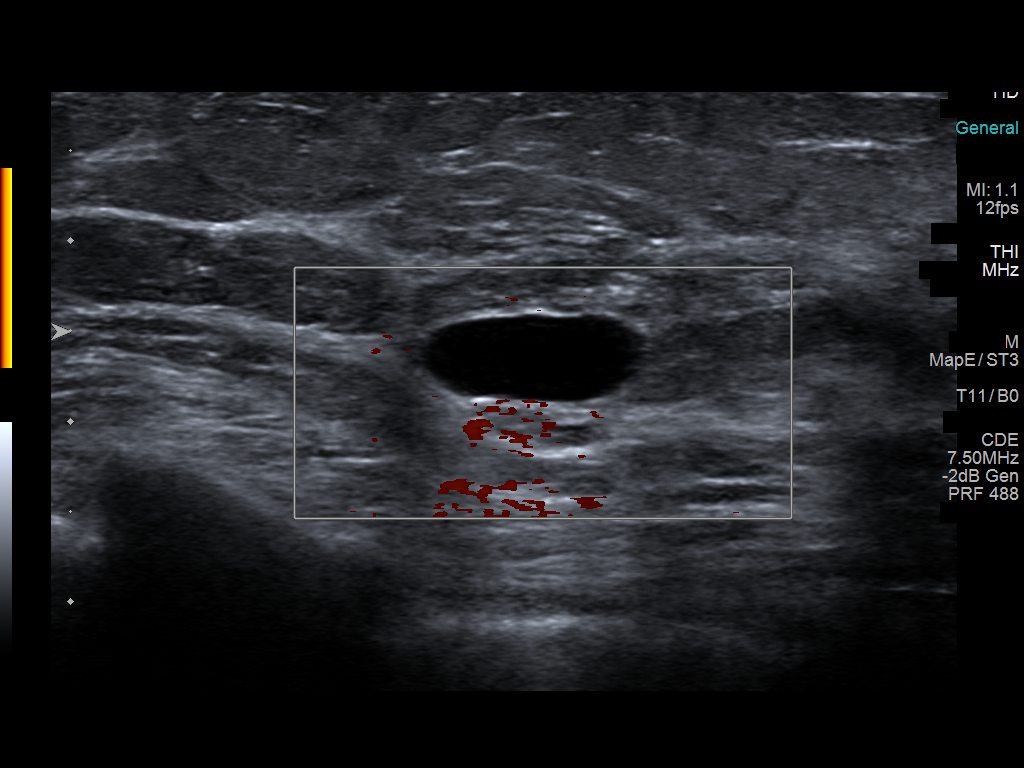

[5 of 5 positions shown; findings below may reference images not displayed]

FINDINGS: Targeted ultrasound is performed, showing a well-circumscribed
anechoic cyst in the left breast at 10 o'clock 4 cm from the nipple
measuring 1.2 x 0.5 x 1.1 cm.
IMPRESSION: Left breast cyst.  No evidence of malignancy.

RECOMMENDATION:
Bilateral screening mammogram in 1 year is recommended.

I have discussed the findings and recommendations with the patient.
If applicable, a reminder letter will be sent to the patient
regarding the next appointment.

BI-RADS CATEGORY  2: Benign.

## 2022-05-23 ENCOUNTER — Other Ambulatory Visit: Payer: Self-pay | Admitting: General Surgery

## 2022-05-23 DIAGNOSIS — Z1239 Encounter for other screening for malignant neoplasm of breast: Secondary | ICD-10-CM

## 2022-06-19 ENCOUNTER — Ambulatory Visit
Admission: RE | Admit: 2022-06-19 | Discharge: 2022-06-19 | Disposition: A | Discharge status: Home / Self Care | Payer: Medicare Other | Source: Ambulatory Visit | Attending: General Surgery | Admitting: General Surgery

## 2022-06-19 DIAGNOSIS — Z1239 Encounter for other screening for malignant neoplasm of breast: Secondary | ICD-10-CM

## 2022-06-19 MED ORDER — GADOPICLENOL 0.5 MMOL/ML IV SOLN
8.0000 mL | Freq: Once | INTRAVENOUS | Status: AC | PRN
  Administered 2022-06-19: 8 mL via INTRAVENOUS

## 2022-12-18 ENCOUNTER — Other Ambulatory Visit: Payer: Self-pay | Admitting: Family Medicine

## 2022-12-18 DIAGNOSIS — Z780 Asymptomatic menopausal state: Secondary | ICD-10-CM

## 2023-07-14 ENCOUNTER — Inpatient Hospital Stay
Admission: RE | Admit: 2023-07-14 | Discharge: 2023-07-14 | Disposition: A | Discharge status: Home / Self Care | Payer: Medicare Other | Source: Ambulatory Visit | Attending: Family Medicine

## 2023-07-14 DIAGNOSIS — Z780 Asymptomatic menopausal state: Secondary | ICD-10-CM

## 2024-01-21 ENCOUNTER — Other Ambulatory Visit: Payer: Self-pay | Admitting: Family Medicine

## 2024-01-21 DIAGNOSIS — Z9189 Other specified personal risk factors, not elsewhere classified: Secondary | ICD-10-CM

## 2024-06-22 ENCOUNTER — Other Ambulatory Visit

## 2024-07-21 ENCOUNTER — Ambulatory Visit
Admission: RE | Admit: 2024-07-21 | Discharge: 2024-07-21 | Disposition: A | Discharge status: Home / Self Care | Source: Ambulatory Visit | Attending: Family Medicine | Admitting: Family Medicine

## 2024-07-21 DIAGNOSIS — Z9189 Other specified personal risk factors, not elsewhere classified: Secondary | ICD-10-CM

## 2024-07-21 MED ORDER — GADOPICLENOL 0.5 MMOL/ML IV SOLN
8.0000 mL | Freq: Once | INTRAVENOUS | Status: AC | PRN
  Administered 2024-07-21: 8 mL via INTRAVENOUS

## 2024-07-22 ENCOUNTER — Other Ambulatory Visit: Payer: Self-pay | Admitting: Family Medicine

## 2024-07-22 DIAGNOSIS — R9389 Abnormal findings on diagnostic imaging of other specified body structures: Secondary | ICD-10-CM
